# Patient Record
Sex: Female | Born: 1960 | Hispanic: Yes | Marital: Married | Smoking: Never smoker
Health system: Southern US, Community
[De-identification: ages and names within clinical notes are randomized; demographics above are authoritative.]

## PROBLEM LIST (undated history)

## (undated) DIAGNOSIS — S83281A Other tear of lateral meniscus, current injury, right knee, initial encounter: Secondary | ICD-10-CM

## (undated) DIAGNOSIS — E119 Type 2 diabetes mellitus without complications: Secondary | ICD-10-CM

## (undated) DIAGNOSIS — E785 Hyperlipidemia, unspecified: Secondary | ICD-10-CM

## (undated) HISTORY — DX: Type 2 diabetes mellitus without complications: E11.9

---

## 2000-08-05 ENCOUNTER — Encounter: Admission: RE | Admit: 2000-08-05 | Discharge: 2000-08-05 | Payer: Self-pay | Admitting: *Deleted

## 2001-04-14 ENCOUNTER — Encounter: Admission: RE | Admit: 2001-04-14 | Discharge: 2001-04-14 | Payer: Self-pay | Admitting: Family Medicine

## 2003-05-24 ENCOUNTER — Emergency Department (HOSPITAL_COMMUNITY): Admission: EM | Admit: 2003-05-24 | Discharge: 2003-05-24 | Payer: Self-pay | Admitting: Emergency Medicine

## 2003-05-24 ENCOUNTER — Encounter: Payer: Self-pay | Admitting: Emergency Medicine

## 2014-03-03 ENCOUNTER — Other Ambulatory Visit (HOSPITAL_COMMUNITY): Payer: Self-pay | Admitting: Internal Medicine

## 2014-03-03 DIAGNOSIS — Z1231 Encounter for screening mammogram for malignant neoplasm of breast: Secondary | ICD-10-CM

## 2014-03-11 ENCOUNTER — Ambulatory Visit (HOSPITAL_COMMUNITY)
Admission: RE | Admit: 2014-03-11 | Discharge: 2014-03-11 | Disposition: A | Discharge status: Home / Self Care | Payer: Self-pay | Source: Ambulatory Visit | Attending: Internal Medicine | Admitting: Internal Medicine

## 2014-03-11 DIAGNOSIS — Z1231 Encounter for screening mammogram for malignant neoplasm of breast: Secondary | ICD-10-CM

## 2015-09-22 DIAGNOSIS — Z0100 Encounter for examination of eyes and vision without abnormal findings: Secondary | ICD-10-CM

## 2015-09-22 NOTE — Congregational Nurse Program (Signed)
Brief vision with eye card chart.  CN referred to FAI contact who will notify the [facility] for exam and eye glasses. Flu vaccine administered.

## 2016-10-17 ENCOUNTER — Encounter (HOSPITAL_COMMUNITY): Payer: Self-pay | Admitting: Emergency Medicine

## 2016-10-17 ENCOUNTER — Ambulatory Visit (HOSPITAL_COMMUNITY)
Admission: EM | Admit: 2016-10-17 | Discharge: 2016-10-17 | Disposition: A | Discharge status: Home / Self Care | Payer: Self-pay | Attending: Emergency Medicine | Admitting: Emergency Medicine

## 2016-10-17 DIAGNOSIS — H1131 Conjunctival hemorrhage, right eye: Secondary | ICD-10-CM

## 2016-10-17 DIAGNOSIS — H5711 Ocular pain, right eye: Secondary | ICD-10-CM

## 2016-10-17 NOTE — ED Triage Notes (Signed)
[facility] with a complaint of left eye pain that started yesterday. The patient denied any known injury.

## 2016-10-17 NOTE — ED Provider Notes (Signed)
CSN: [id number]     Arrival date & time 10/17/16  1259 History   None    Chief Complaint  Patient presents with  . Eye Problem   (Consider location/radiation/quality/duration/timing/severity/associated sxs/prior Treatment) Was at work today and was told she has red right eye but no sx's today.  She states that she has had on occasion some right eye flashing and sharp pain but not today and she did not have a right eye injury nor does she c/o visual acuity problems or eye pain today.   The history is provided by the patient.  Eye Problem  Location:  Right eye Onset quality:  Sudden Duration:  1 day Timing:  Constant Progression:  Worsening Chronicity:  New Relieved by:  None tried Worsened by:  Nothing Associated symptoms: redness   Risk factors: conjunctival hemorrhage     History reviewed. No pertinent past medical history. History reviewed. No pertinent surgical history. History reviewed. No pertinent family history. Social History  Substance Use Topics  . Smoking status: Never Smoker  . Smokeless tobacco: Never Used  . Alcohol use No   OB History    No data available     Review of Systems  Constitutional: Negative.   HENT: Negative.   Eyes: Positive for redness.  Respiratory: Negative.   Cardiovascular: Negative.   Gastrointestinal: Negative.   Endocrine: Negative.   Genitourinary: Negative.   Musculoskeletal: Negative.   Skin: Negative.   Allergic/Immunologic: Negative.   Neurological: Negative.   Hematological: Negative.   Psychiatric/Behavioral: Negative.     Allergies  Review of patient's allergies indicates no known allergies.  Home Medications   Prior to Admission medications   Not on File   Meds Ordered and Administered this Visit  Medications - No data to display  BP 127/86 (BP Location: Right Arm)   Pulse 84   Temp 98.2 F (36.8 C) (Oral)   Resp 16   SpO2 98%  No data found.   Physical Exam  Constitutional: She appears  well-developed and well-nourished.  HENT:  Head: Normocephalic and atraumatic.  Eyes: EOM are normal. Pupils are equal, round, and reactive to light.  Right eye conjunctival hemorrhage  Pulmonary/Chest: Effort normal and breath sounds normal.  Abdominal: Soft. Bowel sounds are normal.  Nursing note and vitals reviewed.   Urgent Care Course   Clinical Course    Procedures (including critical care time)  Labs Review Labs Reviewed - No data to display  Imaging Review No results found.   Visual Acuity Review  Right Eye Distance:   Left Eye Distance:   Bilateral Distance:    Right Eye Near:   Left Eye Near:    Bilateral Near:   (unable to visual acuity due to language barrier)      MDM  Subconjunctival hemorrhage OD - Reassurance that this will resolve with time.  Occasional right eye pains - This is not assosciated with her conjunctival hemorrhage and is not occurring at present and seems to be chronic so will refer to opthamology for eval.    Deatra CanterWilliam J , FNP 10/17/16 1459

## 2016-10-19 LAB — GLUCOSE, POCT (MANUAL RESULT ENTRY): POC Glucose: 158 mg/dl — AB (ref 70–99)

## 2017-05-14 ENCOUNTER — Encounter (HOSPITAL_COMMUNITY): Payer: Self-pay | Admitting: Emergency Medicine

## 2017-05-14 ENCOUNTER — Ambulatory Visit (HOSPITAL_COMMUNITY)
Admission: EM | Admit: 2017-05-14 | Discharge: 2017-05-14 | Disposition: A | Discharge status: Home / Self Care | Payer: Self-pay | Attending: Family Medicine | Admitting: Family Medicine

## 2017-05-14 DIAGNOSIS — M79674 Pain in right toe(s): Secondary | ICD-10-CM

## 2017-05-14 DIAGNOSIS — B351 Tinea unguium: Secondary | ICD-10-CM

## 2017-05-14 MED ORDER — IBUPROFEN 600 MG PO TABS: 600.0000 mg | ORAL_TABLET | Freq: Four times a day (QID) | ORAL | 0 refills | Status: DC | PRN

## 2017-05-14 NOTE — Discharge Instructions (Signed)
Take the ibuprofen 600 mg every 6 hours as needed for pain. Call the podiatrist listed on this page for an appointment tomorrow.

## 2017-05-14 NOTE — ED Provider Notes (Signed)
CSN: [id number]     Arrival date & time 05/14/17  1358 History   First MD Initiated Contact with Patient 05/14/17 1557     Chief Complaint  Patient presents with  . Ingrown Toenail   (Consider location/radiation/quality/duration/timing/severity/associated sxs/prior Treatment) Video Interpreter used for information.  56 year old female presents to the urgent care complaining of ingrown toenail involving the right great toe. She states that she developed discomfort approximate 4 years ago in the right great toenail. In the past 3 weeks is been getting worse.      History reviewed. No pertinent past medical history. History reviewed. No pertinent surgical history. History reviewed. No pertinent family history. Social History  Substance Use Topics  . Smoking status: Never Smoker  . Smokeless tobacco: Never Used  . Alcohol use No   OB History    No data available     Review of Systems  Constitutional: Negative.   Skin:       Pain around the right great toenail.  Neurological: Negative.   All other systems reviewed and are negative.   Allergies  Patient has no known allergies.  Home Medications   Prior to Admission medications   Medication Sig Start Date End Date Taking? Authorizing Provider  ibuprofen (ADVIL,MOTRIN) 600 MG tablet Take 1 tablet (600 mg total) by mouth every 6 (six) hours as needed. 05/14/17   Hayden RasmussenMabe, Deion Swift, NP   Meds Ordered and Administered this Visit  Medications - No data to display  BP 122/78 (BP Location: Left Arm)   Pulse 61   Temp 98.4 F (36.9 C) (Oral)   Resp 16   SpO2 97%  No data found.   Physical Exam  Constitutional: She is oriented to person, place, and time. She appears well-developed and well-nourished. No distress.  Eyes: EOM are normal.  Neck: Neck supple.  Cardiovascular: Normal rate.   Pulmonary/Chest: Effort normal. No respiratory distress.  Musculoskeletal: She exhibits no edema.  Neurological: She is alert and oriented to  person, place, and time. She exhibits normal muscle tone.  Skin: Skin is warm and dry.  No apparent ingrown toenail involving the right great toe. There is no redness, swelling, drainage or tenderness to the soft tissue around the toenail. Tenderness is to the toenail directly. It is thick and yellow and the edges are curved inwards. No evidence of trauma. Skin is intact. No fluctuance no lesions.  Psychiatric: She has a normal mood and affect.  Nursing note and vitals reviewed.   Urgent Care Course     Procedures (including critical care time)  Labs Review Labs Reviewed - No data to display  Imaging Review No results found.   Visual Acuity Review  Right Eye Distance:   Left Eye Distance:   Bilateral Distance:    Right Eye Near:   Left Eye Near:    Bilateral Near:         MDM   1. Onychomycosis of right great toe   2. Pain of right great toe    Take the ibuprofen 600 mg every 6 hours as needed for pain. Call the podiatrist listed on this page for an appointment tomorrow.     Hayden RasmussenMabe, Cem Kosman, NP 05/14/17 1624

## 2017-05-14 NOTE — ED Triage Notes (Signed)
Pt here for right greater ingrown toe nail onset 3 week.  A&O x4... NAD... Ambulatory

## 2017-06-05 ENCOUNTER — Ambulatory Visit (INDEPENDENT_AMBULATORY_CARE_PROVIDER_SITE_OTHER): Payer: Self-pay | Admitting: Podiatry

## 2017-06-05 ENCOUNTER — Encounter: Payer: Self-pay | Admitting: Podiatry

## 2017-06-05 DIAGNOSIS — L600 Ingrowing nail: Secondary | ICD-10-CM

## 2017-06-05 NOTE — Progress Notes (Signed)
   Subjective:    Patient ID: Wanda Bailey, female    DOB: [birth year], 56 y.o.   MRN: [id number]  HPI Chief Complaint  Patient presents with  . Ingrown Toenail    Rt foot 1st toenail pain      Review of Systems  Skin: Positive for color change.       Objective:   Physical Exam        Assessment & Plan:

## 2017-06-05 NOTE — Patient Instructions (Signed)

## 2017-06-06 NOTE — Progress Notes (Signed)
Subjective:    Patient ID: Wanda Bailey, female   DOB: 56 y.o.   MRN: [id number]   HPI patient presents with a thick painful right hallux nail    Review of Systems  All other systems reviewed and are negative.       Objective:  Physical Exam  Cardiovascular: Intact distal pulses.   Musculoskeletal: Normal range of motion.  Neurological: She is alert.  Skin: Skin is warm.  Nursing note and vitals reviewed.  Neurovascular status intact muscle strength adequate with patient found to have a thickened deformed right hallux nail that's been painful when pressed     Assessment:    Ingrown toenail deformity with pain right hallux     Plan:   H&P condition reviewed and recommended removal of the nail at this time. Explained procedure and risk and patient wants surgery and today I infiltrated 60 Milligan times like Marcaine mixture remove the hallux nail exposed matrix and applied phenol for applications 30 seconds followed by alcohol lavaged sterile dressing. Reappoint to recheck

## 2017-09-09 ENCOUNTER — Encounter (HOSPITAL_COMMUNITY): Payer: Self-pay | Admitting: Emergency Medicine

## 2017-09-09 ENCOUNTER — Ambulatory Visit (HOSPITAL_COMMUNITY)
Admission: EM | Admit: 2017-09-09 | Discharge: 2017-09-09 | Disposition: A | Discharge status: Home / Self Care | Payer: Self-pay | Attending: Urgent Care | Admitting: Urgent Care

## 2017-09-09 DIAGNOSIS — L739 Follicular disorder, unspecified: Secondary | ICD-10-CM

## 2017-09-09 DIAGNOSIS — R21 Rash and other nonspecific skin eruption: Secondary | ICD-10-CM

## 2017-09-09 HISTORY — DX: Hyperlipidemia, unspecified: E78.5

## 2017-09-09 MED ORDER — CEPHALEXIN 500 MG PO CAPS: 500.0000 mg | ORAL_CAPSULE | Freq: Three times a day (TID) | ORAL | 0 refills | Status: DC

## 2017-09-09 NOTE — ED Provider Notes (Signed)
MRN: [id number] DOB: October 15, 1961  Subjective:   Wanda Bailey is a 56 y.o. female presenting for chief complaint of Rash  Reports 1 week history of itching over her scalp towards the back of her head that has now become painful bumps. She was seen at a different practice and reports that she started using a cream she cannot remember the name of. It helped very little. Now she states that she is having other similar red bumps over her left chin. Denies fever, numbness or tingling, oral lesions, rash over palms and soles of feet, facial swelling, chest tightness. She is keeping her normal routine, not eating new foods or using new hygiene products.   [name] is currently is a topical cream as above and has No Known Allergies.  [name]  has a past medical history of Hyperlipidemia. Denies past surgical history.  Objective:   Vitals: BP 140/75   Pulse 64   Temp 98.3 F (36.8 C) (Oral)   Resp 16   Wt 215 lb (97.5 kg)   SpO2 96%   Physical Exam  Constitutional: She is oriented to person, place, and time. She appears well-developed and well-nourished.  HENT:  Head:    Cardiovascular: Normal rate.   Pulmonary/Chest: Effort normal.  Neurological: She is alert and oriented to person, place, and time.   Assessment and Plan :   Rash and nonspecific skin eruption  Folliculitis   Likely started out as an allergic rash given initial symptom of itching only. This is now a folliculitis so I will have patient start Keflex with Zyrtec and Zantac. She is to monitor the products she is using for hygiene and use Dove for gentle skin only. Return-to-clinic precautions discussed, patient verbalized understanding.   Wallis Bamberg, PA-C [facility]  09/09/2017  6:42 PM    Wallis Bamberg, PA-C 09/09/17 1900

## 2017-09-09 NOTE — ED Triage Notes (Signed)
PT reports a bump on back of head. PT reports it was itchy and she was given a cream for it. PT reports it has no resolved.

## 2017-09-09 NOTE — Discharge Instructions (Signed)
Por favor use Dove for gentle skin para banarse. Tambien use Zyrtec (cetirizine)  una vez diaramente. Tome Zyrtec junto con Zantac (ranitidine)  dos veces diaramente.

## 2020-08-25 ENCOUNTER — Encounter (HOSPITAL_COMMUNITY): Payer: Self-pay | Admitting: *Deleted

## 2020-08-25 ENCOUNTER — Emergency Department (HOSPITAL_COMMUNITY)
Admission: EM | Admit: 2020-08-25 | Discharge: 2020-08-26 | Disposition: A | Discharge status: Home / Self Care | Payer: Self-pay | Attending: Emergency Medicine | Admitting: Emergency Medicine

## 2020-08-25 ENCOUNTER — Other Ambulatory Visit: Payer: Self-pay

## 2020-08-25 DIAGNOSIS — M545 Low back pain: Secondary | ICD-10-CM | POA: Insufficient documentation

## 2020-08-25 DIAGNOSIS — R110 Nausea: Secondary | ICD-10-CM | POA: Insufficient documentation

## 2020-08-25 DIAGNOSIS — Z5321 Procedure and treatment not carried out due to patient leaving prior to being seen by health care provider: Secondary | ICD-10-CM | POA: Insufficient documentation

## 2020-08-25 LAB — COMPREHENSIVE METABOLIC PANEL
ALT: 26 U/L (ref 0–44)
AST: 28 U/L (ref 15–41)
Albumin: 4.2 g/dL (ref 3.5–5.0)
Alkaline Phosphatase: 60 U/L (ref 38–126)
Anion gap: 12 (ref 5–15)
BUN: 20 mg/dL (ref 6–20)
CO2: 25 mmol/L (ref 22–32)
Calcium: 9.3 mg/dL (ref 8.9–10.3)
Chloride: 103 mmol/L (ref 98–111)
Creatinine, Ser: 0.72 mg/dL (ref 0.44–1.00)
GFR calc Af Amer: >60 mL/min (ref >60)
GFR calc non Af Amer: >60 mL/min (ref >60)
Glucose, Bld: 127 mg/dL — ABNORMAL HIGH (ref 70–99)
Potassium: 3.9 mmol/L (ref 3.5–5.1)
Sodium: 140 mmol/L (ref 135–145)
Total Bilirubin: 0.5 mg/dL (ref 0.3–1.2)
Total Protein: 7.5 g/dL (ref 6.5–8.1)

## 2020-08-25 LAB — URINALYSIS, ROUTINE W REFLEX MICROSCOPIC
Bacteria, UA: NONE SEEN
Bilirubin Urine: NEGATIVE
Glucose, UA: NEGATIVE mg/dL
Hgb urine dipstick: NEGATIVE
Ketones, ur: NEGATIVE mg/dL
Leukocytes,Ua: NEGATIVE
Nitrite: NEGATIVE
Protein, ur: NEGATIVE mg/dL
Specific Gravity, Urine: 1.026 (ref 1.005–1.030)
pH: 5 (ref 5.0–8.0)

## 2020-08-25 LAB — CBC
HCT: 41.3 % (ref 36.0–46.0)
Hemoglobin: 12.7 g/dL (ref 12.0–15.0)
MCH: 28 pg (ref 26.0–34.0)
MCHC: 30.8 g/dL (ref 30.0–36.0)
MCV: 91 fL (ref 80.0–100.0)
Platelets: 238 10*3/uL (ref 150–400)
RBC: 4.54 MIL/uL (ref 3.87–5.11)
RDW: 13.9 % (ref 11.5–15.5)
WBC: 10.9 10*3/uL — ABNORMAL HIGH (ref 4.0–10.5)
nRBC: 0 % (ref 0.0–0.2)

## 2020-08-25 NOTE — ED Notes (Signed)
Called pt name x3 for room. No response from pt.

## 2020-08-25 NOTE — ED Triage Notes (Signed)
The pt has had lower back pain tonight with nauseated

## 2021-06-24 ENCOUNTER — Ambulatory Visit (INDEPENDENT_AMBULATORY_CARE_PROVIDER_SITE_OTHER): Payer: Self-pay

## 2021-06-24 ENCOUNTER — Ambulatory Visit
Admission: EM | Admit: 2021-06-24 | Discharge: 2021-06-24 | Disposition: A | Discharge status: Home / Self Care | Payer: Self-pay | Attending: Emergency Medicine | Admitting: Emergency Medicine

## 2021-06-24 ENCOUNTER — Ambulatory Visit: Payer: Self-pay

## 2021-06-24 ENCOUNTER — Encounter: Payer: Self-pay | Admitting: Emergency Medicine

## 2021-06-24 ENCOUNTER — Other Ambulatory Visit: Payer: Self-pay

## 2021-06-24 DIAGNOSIS — M25561 Pain in right knee: Secondary | ICD-10-CM

## 2021-06-24 MED ORDER — NAPROXEN 500 MG PO TABS: 500.0000 mg | ORAL_TABLET | Freq: Two times a day (BID) | ORAL | 0 refills | Status: DC

## 2021-06-24 MED ORDER — PREDNISONE 10 MG PO TABS: ORAL_TABLET | ORAL | 0 refills | Status: DC

## 2021-06-24 NOTE — ED Provider Notes (Signed)
[facility]    CSN: [id number] Arrival date & time: 06/24/21  2706      History   Chief Complaint Chief Complaint  Patient presents with   Knee Pain    HPI Wanda Bailey is a 60 y.o. female history of hyperlipidemia presenting today for evaluation of knee pain and swelling. Symptoms have been going on for 2 weeks. Denies injury/fall/increase in activity. Reports swelling into leg. Denies pain initially, pain began approximately 1 week. Denies history of knee problems. Has tried ibuprofen 800 mg.  Denies history of arthritis.  Symptoms worse with standing.  Denies any sensation of instability already popping or tearing sensations.  HPI  Past Medical History:  Diagnosis Date   Hyperlipidemia     There are no problems to display for this patient.   History reviewed. No pertinent surgical history.  OB History   No obstetric history on file.      Home Medications    Prior to Admission medications   Medication Sig Start Date End Date Taking? Authorizing Provider  ibuprofen (ADVIL,MOTRIN) 600 MG tablet Take 1 tablet (600 mg total) by mouth every 6 (six) hours as needed. 05/14/17   Hayden Rasmussen, NP  naproxen (NAPROSYN) 500 MG tablet Take 1 tablet (500 mg total) by mouth 2 (two) times daily. [place] si necesita despues de termina el curso de prednisone; Toma con comida 06/24/21  Yes Abas Leicht C, PA-C  predniSONE (DELTASONE) 10 MG tablet Begin with 6 tabs on day 1, 5 tab on day 2, 4 tab on day 3, 3 tab on day 4, 2 tab on day 5, 1 tab on day 6-take with food 06/24/21  Yes Davidmichael Zarazua, Jemison C, PA-C    Family History History reviewed. No pertinent family history.  Social History Social History   Tobacco Use   Smoking status: Never   Smokeless tobacco: Never  Substance Use Topics   Alcohol use: No     Allergies   Patient has no known allergies.   Review of Systems Review of Systems  Constitutional:  Negative for fatigue and fever.  Eyes:  Negative for  visual disturbance.  Respiratory:  Negative for shortness of breath.   Cardiovascular:  Negative for chest pain.  Gastrointestinal:  Negative for abdominal pain, nausea and vomiting.  Musculoskeletal:  Positive for arthralgias and joint swelling.  Skin:  Negative for color change, rash and wound.  Neurological:  Negative for dizziness, weakness, light-headedness and headaches.    Physical Exam Triage Vital Signs ED Triage Vitals  Enc Vitals Group     BP      Pulse      Resp      Temp      Temp src      SpO2      Weight      Height      Head Circumference      Peak Flow      Pain Score      Pain Loc      Pain Edu?      Excl. in GC?    No data found.  Updated Vital Signs BP 128/65 (BP Location: Left Arm)   Pulse 68   Temp 98.3 F (36.8 C) (Oral)   Resp 18   SpO2 99%   Visual Acuity Right Eye Distance:   Left Eye Distance:   Bilateral Distance:    Right Eye Near:   Left Eye Near:    Bilateral Near:  Physical Exam Vitals and nursing note reviewed.  Constitutional:      Appearance: She is well-developed.     Comments: No acute distress  HENT:     Head: Normocephalic and atraumatic.     Nose: Nose normal.  Eyes:     Conjunctiva/sclera: Conjunctivae normal.  Cardiovascular:     Rate and Rhythm: Normal rate.  Pulmonary:     Effort: Pulmonary effort is normal. No respiratory distress.  Abdominal:     General: There is no distension.  Musculoskeletal:        General: Normal range of motion.     Cervical back: Neck supple.     Comments: Right knee with significant swelling diffusely compared to left, no overlying erythema or warmth, nontender to palpation over patella, tenderness to palpation to bilateral lower joint lines, tenderness does not extend into popliteal area or suprapatellar area, no calf tenderness, no overlying calf swelling erythema, dorsalis pedis 2+ distally; limited flexion to approximately 120 degrees  Skin:    General: Skin is warm and  dry.  Neurological:     Mental Status: She is alert and oriented to person, place, and time.     UC Treatments / Results  Labs (all labs ordered are listed, but only abnormal results are displayed) Labs Reviewed - No data to display  EKG   Radiology DG Knee AP/LAT W/Sunrise Right  Result Date: 06/24/2021 CLINICAL DATA:  Right knee pain with swelling for 2 weeks. No specific injury EXAM: RIGHT KNEE 3 VIEWS COMPARISON:  None. FINDINGS: Knee joint effusion. No fracture or subluxation. Mild degenerative marginal spurring seen at the tibial spines and patella. Subjective osteopenia. IMPRESSION: Joint effusion without acute osseous finding or joint space narrowing. Electronically Signed   By: Marnee Spring M.D.   On: 06/24/2021 10:57    Procedures Procedures (including critical care time)  Medications Ordered in UC Medications - No data to display  Initial Impression / Assessment and Plan / UC Course  I have reviewed the triage vital signs and the nursing notes.  Pertinent labs & imaging results that were available during my care of the patient were reviewed by me and considered in my medical decision making (see chart for details).     X-ray negative for significant arthropathy, mild spurring noted, no overlying erythema or warmth, low suspicion of septic arthritis, gout, has been using NSAIDs without relief, will trial prednisone taper followed by return to NSAIDs as needed, Ace wrap applied, ice and elevate, follow-up with sports medicine if persisting.  Low suspicion of DVT, no pain posteriorly or extending into calf, no overlying erythema or warmth.  Discussed strict return precautions. Patient verbalized understanding and is agreeable with plan.  Final Clinical Impressions(s) / UC Diagnoses   Final diagnoses:  Acute pain of right knee     Discharge Instructions      [place] prednisone taper x6 dias-empiece con 6 pastillas dia 1, 5 pastillas dia 2, 4 pastillas dia 3, 3 en  dia 4, 2 en dia 5, 1 dia 6- toma con comida y en la manana si puede [place] naprosyn con comida despures de terminar el curso de prednisone - 2 veces al dia si necesita para dolor, hinchazon Levante su pierna y aplique helo Regrese si no mejoran o tiene Multimedia programmer, mas hinchazon, cambio en el color del piel     ED Prescriptions     Medication Sig Dispense Auth. Provider   predniSONE (DELTASONE) 10 MG tablet Begin with 6 tabs  on day 1, 5 tab on day 2, 4 tab on day 3, 3 tab on day 4, 2 tab on day 5, 1 tab on day 6-take with food 21 tablet Josslynn Mentzer C, PA-C   naproxen (NAPROSYN) 500 MG tablet Take 1 tablet (500 mg total) by mouth 2 (two) times daily. [place] si necesita despues de termina el curso de prednisone; Toma con comida 30 tablet Shaeley Segall, [place] C, PA-C      PDMP not reviewed this encounter.   Lew Dawes, PA-C 06/24/21 1143

## 2021-06-24 NOTE — ED Triage Notes (Signed)
Pt sts right knee pain with some swelling x 2 weeks; unsure of injury

## 2021-06-24 NOTE — Discharge Instructions (Addendum)
[place] prednisone taper x6 dias-empiece con 6 pastillas dia 1, 5 pastillas dia 2, 4 pastillas dia 3, 3 en dia 4, 2 en dia 5, 1 dia 6- toma con comida y en la manana si puede [place] naprosyn con comida despures de terminar el curso de prednisone - 2 veces al dia si necesita para dolor, hinchazon Levante su pierna y aplique helo Regrese si no mejoran o tiene Multimedia programmer, mas hinchazon, cambio en el color del piel

## 2021-07-11 ENCOUNTER — Ambulatory Visit (INDEPENDENT_AMBULATORY_CARE_PROVIDER_SITE_OTHER): Payer: Self-pay | Admitting: Orthopaedic Surgery

## 2021-07-11 VITALS — Ht 61.0 in | Wt 193.0 lb

## 2021-07-11 DIAGNOSIS — M1711 Unilateral primary osteoarthritis, right knee: Secondary | ICD-10-CM

## 2021-07-11 MED ORDER — LIDOCAINE HCL 1 % IJ SOLN
2.0000 mL | INTRAMUSCULAR | Status: AC | PRN
  Administered 2021-07-11: 2 mL

## 2021-07-11 MED ORDER — METHYLPREDNISOLONE ACETATE 40 MG/ML IJ SUSP
40.0000 mg | INTRAMUSCULAR | Status: AC | PRN
  Administered 2021-07-11: 40 mg via INTRA_ARTICULAR

## 2021-07-11 MED ORDER — BUPIVACAINE HCL 0.5 % IJ SOLN
2.0000 mL | INTRAMUSCULAR | Status: AC | PRN
  Administered 2021-07-11: 2 mL via INTRA_ARTICULAR

## 2021-07-11 NOTE — Progress Notes (Signed)
Office Visit Note   Patient: Wanda Bailey           Date of Birth: 1961/01/22           MRN: [id number] Visit Date: 07/11/2021              Requested by: No referring provider defined for this encounter. PCP: Patient, No Pcp Per (Inactive)   Assessment & Plan: Visit Diagnoses:  1. Primary osteoarthritis of right knee     Plan: Based on findings impression is right knee OA exacerbation with effusion.  Aspiration injection performed today.  15 cc obtained.  She will take it easy over the next month and then gradually increase activity as tolerated.  Fluid sent to lab.    Follow-Up Instructions: Return if symptoms worsen or fail to improve.   Orders:  Orders Placed This Encounter  Procedures   Large Joint Inj   No orders of the defined types were placed in this encounter.     Procedures: Large Joint Inj: R knee on 07/11/2021 3:22 PM Indications: pain Details: 22 G needle  Arthrogram: No  Medications: 40 mg methylPREDNISolone acetate 40 MG/ML; 2 mL lidocaine 1 %; 2 mL bupivacaine 0.5 % Consent was given by the patient. Patient was prepped and draped in the usual sterile fashion.      Clinical Data: No additional findings.   Subjective: Chief Complaint  Patient presents with   Right Knee - Pain    Ms. Wanda Bailey is a 59 year old Wanda here with interpreter for chronic right knee pain for a few months.  Denies any injuries.  This is progressively gotten worse and she reports subjective swelling.  Denies any mechanical symptoms.  She reports painless popping.  She has been to her PCP who has prescribed naproxen and a prednisone Dosepak without any relief.   Review of Systems  Constitutional: Negative.   HENT: Negative.    Eyes: Negative.   Respiratory: Negative.    Cardiovascular: Negative.   Endocrine: Negative.   Musculoskeletal: Negative.   Neurological: Negative.   Hematological: Negative.   Psychiatric/Behavioral: Negative.    All other systems  reviewed and are negative.   Objective: Vital Signs: Ht 5\' 1"  (1.549 m)   Wt 193 lb (87.5 kg)   BMI 36.47 kg/m   Physical Exam Vitals and nursing note reviewed.  Constitutional:      Appearance: She is well-developed.  HENT:     Head: Normocephalic and atraumatic.  Pulmonary:     Effort: Pulmonary effort is normal.  Abdominal:     Palpations: Abdomen is soft.  Musculoskeletal:     Cervical back: Neck supple.  Skin:    General: Skin is warm.     Capillary Refill: Capillary refill takes less than 2 seconds.  Neurological:     Mental Status: She is alert and oriented to person, place, and time.  Psychiatric:        Behavior: Behavior normal.        Thought Content: Thought content normal.        Judgment: Judgment normal.    Ortho Exam Right knee shows a moderate joint effusion.  She lacks a few degrees of full extension.  Collaterals and cruciates are stable.  No joint line tenderness.  No significant crepitus. Specialty Comments:  No specialty comments available.  Imaging: No results found.   PMFS History: There are no problems to display for this patient.  Past Medical History:  Diagnosis Date   Hyperlipidemia  No family history on file.  No past surgical history on file. Social History   Occupational History   Not on file  Tobacco Use   Smoking status: Never   Smokeless tobacco: Never  Substance and Sexual Activity   Alcohol use: No   Drug use: Not on file   Sexual activity: Not on file

## 2021-07-11 NOTE — Addendum Note (Signed)
Addended by: Shonna Chock on: 07/11/2021 03:53 PM   Modules accepted: Orders

## 2021-07-12 LAB — SYNOVIAL FLUID ANALYSIS, COMPLETE
Basophils, %: 0 %
Eosinophils-Synovial: 3 % — ABNORMAL HIGH (ref 0–2)
Lymphocytes-Synovial Fld: 56 % (ref 0–74)
Monocyte/Macrophage: 36 % (ref 0–69)
Neutrophil, Synovial: 5 % (ref 0–24)
Synoviocytes, %: 0 % (ref 0–15)
WBC, Synovial: 368 cells/uL — ABNORMAL HIGH (ref <150)

## 2021-07-12 LAB — TIQ-NTM

## 2021-08-08 ENCOUNTER — Telehealth: Payer: Self-pay | Admitting: Orthopaedic Surgery

## 2021-08-08 NOTE — Telephone Encounter (Signed)
She needed an appt.  Appt made when she came in our office

## 2021-08-08 NOTE — Telephone Encounter (Signed)
Patient called asked if she could get a call back by someone that speaks spanish.  The number to contact patient is [phone]

## 2021-08-09 ENCOUNTER — Encounter: Payer: Self-pay | Admitting: Orthopaedic Surgery

## 2021-08-09 ENCOUNTER — Other Ambulatory Visit: Payer: Self-pay

## 2021-08-09 ENCOUNTER — Ambulatory Visit (INDEPENDENT_AMBULATORY_CARE_PROVIDER_SITE_OTHER): Payer: Self-pay | Admitting: Orthopaedic Surgery

## 2021-08-09 DIAGNOSIS — M25461 Effusion, right knee: Secondary | ICD-10-CM

## 2021-08-09 DIAGNOSIS — M1711 Unilateral primary osteoarthritis, right knee: Secondary | ICD-10-CM

## 2021-08-09 MED ORDER — LIDOCAINE HCL 1 % IJ SOLN
2.0000 mL | INTRAMUSCULAR | Status: AC | PRN
  Administered 2021-08-09: 2 mL

## 2021-08-09 MED ORDER — BUPIVACAINE HCL 0.5 % IJ SOLN
2.0000 mL | INTRAMUSCULAR | Status: AC | PRN
  Administered 2021-08-09: 2 mL via INTRA_ARTICULAR

## 2021-08-09 MED ORDER — METHYLPREDNISOLONE ACETATE 40 MG/ML IJ SUSP
40.0000 mg | INTRAMUSCULAR | Status: AC | PRN
  Administered 2021-08-09: 40 mg via INTRA_ARTICULAR

## 2021-08-09 NOTE — Progress Notes (Signed)
   Office Visit Note   Patient: Ennifer Harston           Date of Birth: Aug 07, 1961           MRN: [id number] Visit Date: 08/09/2021              Requested by: No referring provider defined for this encounter. PCP: Patient, No Pcp Per (Inactive)   Assessment & Plan: Visit Diagnoses:  1. Primary osteoarthritis of right knee   2. Effusion, right knee     Plan: Impression is right knee osteoarthritis with recurrent effusion.  Repeat aspiration and injection with cortisone today.  We obtained 10 cc of synovial fluid.  Going forward I strongly urged her to apply for [facility] financial assistance so that future medical care will not be so financially burdensome.  Follow-Up Instructions: Return if symptoms worsen or fail to improve.   Orders:  No orders of the defined types were placed in this encounter.  No orders of the defined types were placed in this encounter.     Procedures: Large Joint Inj: R knee on 08/09/2021 6:33 PM Indications: pain Details: 22 G needle  Arthrogram: No  Medications: 40 mg methylPREDNISolone acetate 40 MG/ML; 2 mL lidocaine 1 %; 2 mL bupivacaine 0.5 % Consent was given by the patient. Patient was prepped and draped in the usual sterile fashion.      Clinical Data: No additional findings.   Subjective: Chief Complaint  Patient presents with   Right Knee - Follow-up    [name] comes back in today for recurrent right knee pain and effusion.  Translator present today.  She continues to have pain in the right knee and has difficulty straightening the knee completely.   Review of Systems  Constitutional: Negative.   HENT: Negative.    Eyes: Negative.   Respiratory: Negative.    Cardiovascular: Negative.   Endocrine: Negative.   Musculoskeletal: Negative.   Neurological: Negative.   Hematological: Negative.   Psychiatric/Behavioral: Negative.    All other systems reviewed and are negative.   Objective: Vital Signs: There were no  vitals taken for this visit.  Physical Exam Vitals and nursing note reviewed.  Constitutional:      Appearance: She is well-developed.  Pulmonary:     Effort: Pulmonary effort is normal.  Skin:    General: Skin is warm.     Capillary Refill: Capillary refill takes less than 2 seconds.  Neurological:     Mental Status: She is alert and oriented to person, place, and time.  Psychiatric:        Behavior: Behavior normal.        Thought Content: Thought content normal.        Judgment: Judgment normal.    Ortho Exam Right knee shows a small joint effusion.  Moderate pain and limitation with range of motion. Specialty Comments:  No specialty comments available.  Imaging: No results found.   PMFS History: There are no problems to display for this patient.  Past Medical History:  Diagnosis Date   Hyperlipidemia     History reviewed. No pertinent family history.  History reviewed. No pertinent surgical history. Social History   Occupational History   Not on file  Tobacco Use   Smoking status: Never   Smokeless tobacco: Never  Substance and Sexual Activity   Alcohol use: No   Drug use: Not on file   Sexual activity: Not on file

## 2021-10-13 ENCOUNTER — Telehealth: Payer: Self-pay

## 2021-10-13 NOTE — Telephone Encounter (Signed)
Copied from CRM [id number]. Topic: General - Other >> Oct 13, 2021  1:10 PM Jaquita Rector A wrote: Reason for CRM: Patient called in stated that she received the number to call in and request a call back from [facility] to get set up to help with a bill she have received from [facility] for past visits.   Ph# [phone]

## 2021-10-16 NOTE — Telephone Encounter (Signed)
I return Pt call, she was inform that she need to be an establish Pt at the clinic, to apply for the financial programs

## 2021-11-08 ENCOUNTER — Encounter: Payer: Self-pay | Admitting: Physician Assistant

## 2021-11-08 ENCOUNTER — Other Ambulatory Visit: Payer: Self-pay

## 2021-11-08 ENCOUNTER — Ambulatory Visit (INDEPENDENT_AMBULATORY_CARE_PROVIDER_SITE_OTHER): Payer: Self-pay | Admitting: Physician Assistant

## 2021-11-08 VITALS — BP 112/75 | HR 61 | Temp 97.5°F | Resp 16 | Ht 62.0 in | Wt 196.2 lb

## 2021-11-08 DIAGNOSIS — Z758 Other problems related to medical facilities and other health care: Secondary | ICD-10-CM

## 2021-11-08 DIAGNOSIS — Z0000 Encounter for general adult medical examination without abnormal findings: Secondary | ICD-10-CM

## 2021-11-08 DIAGNOSIS — G8929 Other chronic pain: Secondary | ICD-10-CM

## 2021-11-08 DIAGNOSIS — Z79899 Other long term (current) drug therapy: Secondary | ICD-10-CM

## 2021-11-08 DIAGNOSIS — R7309 Other abnormal glucose: Secondary | ICD-10-CM

## 2021-11-08 DIAGNOSIS — M25569 Pain in unspecified knee: Secondary | ICD-10-CM

## 2021-11-08 DIAGNOSIS — E7800 Pure hypercholesterolemia, unspecified: Secondary | ICD-10-CM

## 2021-11-08 DIAGNOSIS — Z789 Other specified health status: Secondary | ICD-10-CM

## 2021-11-08 MED ORDER — MELOXICAM 15 MG PO TABS: 15.0000 mg | ORAL_TABLET | Freq: Every day | ORAL | 2 refills | Status: DC

## 2021-11-08 MED ORDER — ATORVASTATIN CALCIUM 20 MG PO TABS: 20.0000 mg | ORAL_TABLET | Freq: Every day | ORAL | 1 refills | Status: DC

## 2021-11-08 NOTE — Progress Notes (Signed)
Patient is here to est care with provider. Patient said her knee hurts when she wals sometimes.

## 2021-11-08 NOTE — Progress Notes (Signed)
Patient ID: Wanda Bailey, female   DOB: 27-Mar-1961, 60 y.o.   MRN: [id number]   Wanda Bailey, is a 60 y.o. female  BUL:[id number]  [id number]:[id number]  DOB - 1960-12-18  Chief Complaint  Patient presents with   Establish Care       Subjective:   Wanda Bailey is a 60 y.o. female here today to establish care.  Dr Roda Shutters has been following for knee pain but naproxen is not helping.  Has not had blood work in a while.  Was previously on metformin and atorvastatin.     Patient has No headache, No chest pain, No abdominal pain - No Nausea, No new weakness tingling or numbness, No Cough - SOB.  No problems updated.  ALLERGIES: No Known Allergies  PAST MEDICAL HISTORY: Past Medical History:  Diagnosis Date   Diabetes mellitus without complication (HCC)    Hyperlipidemia     MEDICATIONS AT HOME: Prior to Admission medications   Medication Sig Start Date End Date Taking? Authorizing Provider  meloxicam (MOBIC) 15 MG tablet Take 1 tablet (15 mg total) by mouth daily. Prn pain 11/08/21  Yes Anders Simmonds, PA-C  atorvastatin (LIPITOR) 20 MG tablet Take 1 tablet (20 mg total) by mouth daily. 11/08/21   Arlis Yale, Marzella Schlein, PA-C    ROS: Neg HEENT Neg resp Neg cardiac Neg GI Neg GU Neg psych Neg neuro  Objective:   Vitals:   11/08/21 0854  BP: 112/75  Pulse: 61  Resp: 16  Temp: (!) 97.5 F (36.4 C)  TempSrc: Oral  SpO2: 95%  Weight: 196 lb 3.2 oz (89 kg)  Height: 5\' 2"  (1.575 m)   Exam General appearance : Awake, alert, not in any distress. Speech Clear. Not toxic looking HEENT: Atraumatic and Normocephalic Neck: Supple, no JVD. No cervical lymphadenopathy.  Chest: Good air entry bilaterally, CTAB.  No rales/rhonchi/wheezing CVS: S1 S2 regular, no murmurs.  Extremities: B/L Lower Ext shows no edema, both legs are warm to touch Neurology: Awake alert, and oriented X 3, CN II-XII intact, Non focal Skin: No Rash  Data Review No results found for:  HGBA1C  Assessment & Plan   1. Elevated glucose I have had a lengthy discussion and provided education about insulin resistance and the intake of too much sugar/refined carbohydrates.  I have advised the patient to work at a goal of eliminating sugary drinks, candy, desserts, sweets, refined sugars, processed foods, and white carbohydrates.  The patient expresses understanding.   - Hemoglobin A1c  2. Elevated cholesterol - Lipid panel - atorvastatin (LIPITOR) 20 MG tablet; Take 1 tablet (20 mg total) by mouth daily.  Dispense: 90 tablet; Refill: 1  3. High risk medication use - Lipid panel - Comprehensive metabolic panel  4. Encounter for medical examination to establish care  5. Chronic knee pain, unspecified laterality F/up with Dr - meloxicam (MOBIC) 15 MG tablet; Take 1 tablet (15 mg total) by mouth daily. Prn pain  Dispense: 30 tablet; Refill: 2  6. Language barrier AMN interpreters (Arturo)used and additional time performing visit was required.     Patient have been counseled extensively about nutrition and exercise. Other issues discussed during this visit include: low cholesterol diet, weight control and daily exercise, foot care, annual eye examinations at Ophthalmology, importance of adherence with medications and regular follow-up. We also discussed long term complications of uncontrolled diabetes and hypertension.   Return in about 3 months (around 02/08/2022) for assign PCP.  The patient was given clear instructions to  go to ER or return to medical center if symptoms don't improve, worsen or new problems develop. The patient verbalized understanding. The patient was told to call to get lab results if they haven't heard anything in the next week.      Georgian Co, PA-C [facility] [place], [place] [phone]   11/08/2021, 9:27 AM

## 2021-11-09 LAB — LIPID PANEL
Chol/HDL Ratio: 5 ratio — ABNORMAL HIGH (ref 0.0–4.4)
Cholesterol, Total: 285 mg/dL — ABNORMAL HIGH (ref 100–199)
HDL: 57 mg/dL (ref >39)
LDL Chol Calc (NIH): 197 mg/dL — ABNORMAL HIGH (ref 0–99)
Triglycerides: 168 mg/dL — ABNORMAL HIGH (ref 0–149)
VLDL Cholesterol Cal: 31 mg/dL (ref 5–40)

## 2021-11-09 LAB — COMPREHENSIVE METABOLIC PANEL
ALT: 18 IU/L (ref 0–32)
AST: 16 IU/L (ref 0–40)
Albumin/Globulin Ratio: 2 (ref 1.2–2.2)
Albumin: 4.8 g/dL (ref 3.8–4.9)
Alkaline Phosphatase: 84 IU/L (ref 44–121)
BUN/Creatinine Ratio: 29 — ABNORMAL HIGH (ref 12–28)
BUN: 16 mg/dL (ref 8–27)
Bilirubin Total: 0.6 mg/dL (ref 0.0–1.2)
CO2: 23 mmol/L (ref 20–29)
Calcium: 9.3 mg/dL (ref 8.7–10.3)
Chloride: 103 mmol/L (ref 96–106)
Creatinine, Ser: 0.55 mg/dL — ABNORMAL LOW (ref 0.57–1.00)
Globulin, Total: 2.4 g/dL (ref 1.5–4.5)
Glucose: 93 mg/dL (ref 70–99)
Potassium: 4.1 mmol/L (ref 3.5–5.2)
Sodium: 141 mmol/L (ref 134–144)
Total Protein: 7.2 g/dL (ref 6.0–8.5)
eGFR: 105 mL/min/{1.73_m2} (ref >59)

## 2021-11-09 LAB — HEMOGLOBIN A1C
Est. average glucose Bld gHb Est-mCnc: 134 mg/dL
Hgb A1c MFr Bld: 6.3 % — ABNORMAL HIGH (ref 4.8–5.6)

## 2021-11-13 ENCOUNTER — Other Ambulatory Visit: Payer: Self-pay | Admitting: Physician Assistant

## 2021-11-13 DIAGNOSIS — R7303 Prediabetes: Secondary | ICD-10-CM

## 2021-11-13 MED ORDER — METFORMIN HCL 500 MG PO TABS: 500.0000 mg | ORAL_TABLET | Freq: Two times a day (BID) | ORAL | 3 refills | Status: DC

## 2021-11-13 NOTE — Progress Notes (Signed)
Patient called and no answer. LVM via interpreter [phone]) to call office back

## 2021-11-21 ENCOUNTER — Telehealth (INDEPENDENT_AMBULATORY_CARE_PROVIDER_SITE_OTHER): Payer: Self-pay

## 2021-11-21 NOTE — Telephone Encounter (Signed)
Call placed to patient with assistance of pacific interpreter [phone]). Patient aware of need to resume metformin and atorvastatin. Both sent to pharmacy. Advised to drink 80-100 ounces of water daily and eat a low sugar low fat diet. She verbalized understanding. Maryjean Morn, CMA

## 2021-11-21 NOTE — Telephone Encounter (Signed)
-----   Message from Guy Franco, RN sent at 11/13/2021 10:56 AM EST -----  ----- Message ----- From: Anders Simmonds, PA-C Sent: 11/13/2021   9:08 AM EST To: Kieth Brightly, RMA  Please call patient.  She definitely needs to resume the atorvastatin bc cholesterol was very high.  She is in the prediabetes range and I sent her a prescription of metformin to restart.  She should also eat low sugar and low fat diet. Drink 80-100 ounces water daily and follow up as planned.  Thanks, Georgian Co, PA-C

## 2021-12-01 ENCOUNTER — Other Ambulatory Visit: Payer: Self-pay

## 2021-12-01 ENCOUNTER — Ambulatory Visit: Payer: Self-pay | Attending: Family Medicine

## 2021-12-22 ENCOUNTER — Ambulatory Visit (INDEPENDENT_AMBULATORY_CARE_PROVIDER_SITE_OTHER): Payer: Self-pay | Admitting: Orthopaedic Surgery

## 2021-12-22 ENCOUNTER — Encounter: Payer: Self-pay | Admitting: Orthopaedic Surgery

## 2021-12-22 ENCOUNTER — Other Ambulatory Visit: Payer: Self-pay

## 2021-12-22 DIAGNOSIS — G8929 Other chronic pain: Secondary | ICD-10-CM

## 2021-12-22 DIAGNOSIS — M25561 Pain in right knee: Secondary | ICD-10-CM

## 2021-12-22 MED ORDER — TRAMADOL HCL 50 MG PO TABS: 50.0000 mg | ORAL_TABLET | Freq: Two times a day (BID) | ORAL | 2 refills | Status: DC | PRN

## 2021-12-22 NOTE — Progress Notes (Signed)
° °  Office Visit Note   Patient: Wanda Bailey           Date of Birth: 11-27-1961           MRN: [id number] Visit Date: 12/22/2021              Requested by: No referring provider defined for this encounter. PCP: Patient, No Pcp Per (Inactive)   Assessment & Plan: Visit Diagnoses:  1. Chronic pain of right knee     Plan: Impression is chronic right knee pain concerning for medial meniscus tear.  At this point, I would like to order an MRI of her right knee to assess for meniscal pathology, but the patient is currently awaiting [facility] financial assistance.  She would like to wait until she has been approved before proceeding with MRI.  She will call us and let us know when she is approved and we will order the scan.  She will call with concerns or questions in the meantime.  Follow-Up Instructions: Return if symptoms worsen or fail to improve.   Orders:  No orders of the defined types were placed in this encounter.  Meds ordered this encounter  Medications   traMADol (ULTRAM) 50 MG tablet    Sig: Take 1 tablet (50 mg total) by mouth every 12 (twelve) hours as needed.    Dispense:  30 tablet    Refill:  2      Procedures: No procedures performed   Clinical Data: No additional findings.   Subjective: Chief Complaint  Patient presents with   Right Knee - Pain    HPI patient is a pleasant 61 year old female who is here today with an interpreter.  She is here with continued right knee pain.  She was seen in our office back in July and then again in August where her right knee was aspirated and injected.  She denies any relief from the injection.  She continues to endorse pain to the anteromedial aspect with associated swelling locking and catching.  She notes that her symptoms are aggravated with standing as well as with twisting.  She has been taking ibuprofen with mild relief.  She has been unable to get an MRI in the past due to being self-pay.  She is currently  awaiting [facility] financial assistance.  Review of Systems as detailed in HPI.  All others reviewed and are negative.   Objective: Vital Signs: There were no vitals taken for this visit.  Physical Exam well-developed well-nourished female no acute distress.  Alert and oriented x3.  Ortho Exam right knee exam shows a small to moderate effusion.  Range of motion from 0 to 100 degrees.  She does have marked tenderness medial joint line.  Specialty Comments:  No specialty comments available.  Imaging: No new imaging   PMFS History: There are no problems to display for this patient.  Past Medical History:  Diagnosis Date   Diabetes mellitus without complication (HCC)    Hyperlipidemia     History reviewed. No pertinent family history.  History reviewed. No pertinent surgical history. Social History   Occupational History   Not on file  Tobacco Use   Smoking status: Never   Smokeless tobacco: Never  Substance and Sexual Activity   Alcohol use: No   Drug use: Not on file   Sexual activity: Not on file

## 2022-01-11 ENCOUNTER — Ambulatory Visit: Payer: Self-pay | Admitting: Orthopaedic Surgery

## 2022-01-16 ENCOUNTER — Telehealth: Payer: Self-pay | Admitting: Orthopaedic Surgery

## 2022-01-16 ENCOUNTER — Other Ambulatory Visit: Payer: Self-pay

## 2022-01-16 DIAGNOSIS — G8929 Other chronic pain: Secondary | ICD-10-CM

## 2022-01-16 DIAGNOSIS — M25561 Pain in right knee: Secondary | ICD-10-CM

## 2022-01-16 NOTE — Telephone Encounter (Signed)
Patient states she has [facility] letter already. MRI order made. They will call her to schedule appt.

## 2022-01-16 NOTE — Telephone Encounter (Signed)
Patient called. Would like Kathlee Nations to call her. [phone]

## 2022-01-22 ENCOUNTER — Encounter (INDEPENDENT_AMBULATORY_CARE_PROVIDER_SITE_OTHER): Payer: Self-pay

## 2022-01-22 ENCOUNTER — Ambulatory Visit (INDEPENDENT_AMBULATORY_CARE_PROVIDER_SITE_OTHER): Payer: Self-pay | Admitting: Family Medicine

## 2022-01-22 ENCOUNTER — Encounter: Payer: Self-pay | Admitting: Family Medicine

## 2022-01-22 VITALS — BP 114/77 | HR 60 | Temp 98.1°F | Resp 16 | Wt 202.4 lb

## 2022-01-22 DIAGNOSIS — Z789 Other specified health status: Secondary | ICD-10-CM

## 2022-01-22 DIAGNOSIS — Z0000 Encounter for general adult medical examination without abnormal findings: Secondary | ICD-10-CM

## 2022-01-22 DIAGNOSIS — Z1231 Encounter for screening mammogram for malignant neoplasm of breast: Secondary | ICD-10-CM

## 2022-01-22 DIAGNOSIS — Z130 Encounter for screening for diseases of the blood and blood-forming organs and certain disorders involving the immune mechanism: Secondary | ICD-10-CM

## 2022-01-22 DIAGNOSIS — Z1329 Encounter for screening for other suspected endocrine disorder: Secondary | ICD-10-CM

## 2022-01-22 DIAGNOSIS — Z13220 Encounter for screening for lipoid disorders: Secondary | ICD-10-CM

## 2022-01-22 LAB — POCT GLYCOSYLATED HEMOGLOBIN (HGB A1C): Hemoglobin A1C: 6.3 % — AB (ref 4.0–5.6)

## 2022-01-22 NOTE — Progress Notes (Signed)
Patient is here for CPE. Patient said she has no new concerns for provider today

## 2022-01-23 ENCOUNTER — Encounter: Payer: Self-pay | Admitting: Family Medicine

## 2022-01-23 LAB — CMP14+EGFR
ALT: 14 IU/L (ref 0–32)
AST: 15 IU/L (ref 0–40)
Albumin/Globulin Ratio: 1.7 (ref 1.2–2.2)
Albumin: 4.4 g/dL (ref 3.8–4.9)
Alkaline Phosphatase: 87 IU/L (ref 44–121)
BUN/Creatinine Ratio: 24 (ref 12–28)
BUN: 14 mg/dL (ref 8–27)
Bilirubin Total: 0.5 mg/dL (ref 0.0–1.2)
CO2: 24 mmol/L (ref 20–29)
Calcium: 9.5 mg/dL (ref 8.7–10.3)
Chloride: 103 mmol/L (ref 96–106)
Creatinine, Ser: 0.58 mg/dL (ref 0.57–1.00)
Globulin, Total: 2.6 g/dL (ref 1.5–4.5)
Glucose: 104 mg/dL — ABNORMAL HIGH (ref 70–99)
Potassium: 4 mmol/L (ref 3.5–5.2)
Sodium: 142 mmol/L (ref 134–144)
Total Protein: 7 g/dL (ref 6.0–8.5)
eGFR: 104 mL/min/{1.73_m2} (ref >59)

## 2022-01-23 LAB — CBC WITH DIFFERENTIAL/PLATELET
Basophils Absolute: 0.1 10*3/uL (ref 0.0–0.2)
Basos: 1 %
EOS (ABSOLUTE): 1.1 10*3/uL — ABNORMAL HIGH (ref 0.0–0.4)
Eos: 18 %
Hematocrit: 40.8 % (ref 34.0–46.6)
Hemoglobin: 13.2 g/dL (ref 11.1–15.9)
Immature Grans (Abs): 0 10*3/uL (ref 0.0–0.1)
Immature Granulocytes: 0 %
Lymphocytes Absolute: 2 10*3/uL (ref 0.7–3.1)
Lymphs: 31 %
MCH: 28.7 pg (ref 26.6–33.0)
MCHC: 32.4 g/dL (ref 31.5–35.7)
MCV: 89 fL (ref 79–97)
Monocytes Absolute: 0.4 10*3/uL (ref 0.1–0.9)
Monocytes: 6 %
Neutrophils Absolute: 2.8 10*3/uL (ref 1.4–7.0)
Neutrophils: 44 %
Platelets: 217 10*3/uL (ref 150–450)
RBC: 4.6 x10E6/uL (ref 3.77–5.28)
RDW: 12.9 % (ref 11.7–15.4)
WBC: 6.4 10*3/uL (ref 3.4–10.8)

## 2022-01-23 LAB — LIPID PANEL
Chol/HDL Ratio: 4.4 ratio (ref 0.0–4.4)
Cholesterol, Total: 263 mg/dL — ABNORMAL HIGH (ref 100–199)
HDL: 60 mg/dL (ref >39)
LDL Chol Calc (NIH): 178 mg/dL — ABNORMAL HIGH (ref 0–99)
Triglycerides: 139 mg/dL (ref 0–149)
VLDL Cholesterol Cal: 25 mg/dL (ref 5–40)

## 2022-01-23 LAB — TSH: TSH: 3.44 u[IU]/mL (ref 0.450–4.500)

## 2022-01-23 NOTE — Progress Notes (Signed)
Established Patient Office Visit  Subjective:  Patient ID: Wanda Bailey, female    DOB: [birth year]  Age: 61 y.o. MRN: [id number]  CC:  Chief Complaint  Patient presents with   Annual Exam    HPI Wanda Bailey presents for routine annual exam. Patient denies acute complaints or concerns. This visit was aided by an interpretor.   Past Medical History:  Diagnosis Date   Diabetes mellitus without complication ([employer])    Hyperlipidemia     No past surgical history on file.  No family history on file.  Social History   Socioeconomic History   Marital status: Married    Spouse name: Not on file   Number of children: Not on file   Years of education: Not on file   Highest education level: Not on file  Occupational History   Not on file  Tobacco Use   Smoking status: Never   Smokeless tobacco: Never  Substance and Sexual Activity   Alcohol use: No   Drug use: Not on file   Sexual activity: Not on file  Other Topics Concern   Not on file  Social History Narrative   Not on file   Social Determinants of Health   Financial Resource Strain: Not on file  Food Insecurity: Not on file  Transportation Needs: Not on file  Physical Activity: Not on file  Stress: Not on file  Social Connections: Not on file  Intimate Partner Violence: Not on file    ROS Review of Systems  All other systems reviewed and are negative.  Objective:   Today's Vitals: BP 114/77    Pulse 60    Temp 98.1 F (36.7 C) (Oral)    Resp 16    Wt 202 lb 6.4 oz (91.8 kg)    SpO2 94%    BMI 37.02 kg/m   Physical Exam Vitals and nursing note reviewed.  Constitutional:      General: She is not in acute distress. HENT:     Head: Normocephalic and atraumatic.     Right Ear: Tympanic membrane, ear canal and external ear normal.     Left Ear: Tympanic membrane, ear canal and external ear normal.     Nose: Nose normal.     Mouth/Throat:     Mouth: Mucous membranes are moist.      Pharynx: Oropharynx is clear.  Eyes:     Conjunctiva/sclera: Conjunctivae normal.     Pupils: Pupils are equal, round, and reactive to light.  Neck:     Thyroid: No thyromegaly.  Cardiovascular:     Rate and Rhythm: Normal rate and regular rhythm.     Heart sounds: Normal heart sounds. No murmur heard. Pulmonary:     Effort: Pulmonary effort is normal. No respiratory distress.     Breath sounds: Normal breath sounds.  Abdominal:     General: There is no distension.     Palpations: Abdomen is soft. There is no mass.     Tenderness: There is no abdominal tenderness.  Musculoskeletal:        General: Normal range of motion.     Cervical back: Normal range of motion and neck supple.  Skin:    General: Skin is warm and dry.  Neurological:     General: No focal deficit present.     Mental Status: She is alert and oriented to person, place, and time.  Psychiatric:        Mood and Affect: Mood normal.  Behavior: Behavior normal.    Assessment & Plan:   1. Annual physical exam Routine labs ordered.  - CBC with Differential - CMP14+EGFR  2. Screening for endocrine disorder  - POCT glycosylated hemoglobin (Hb A1C) - TSH  3. Lipid screening  - Lipid Panel  4. Encounter for screening mammogram for malignant neoplasm of breast Referral for mammogram.  - MM Digital Screening; Future  5. Language barrier to communication     Outpatient Encounter Medications as of 01/22/2022  Medication Sig   atorvastatin (LIPITOR) 20 MG tablet Take 1 tablet (20 mg total) by mouth daily.   meloxicam (MOBIC) 15 MG tablet Take 1 tablet (15 mg total) by mouth daily. Prn pain   metFORMIN (GLUCOPHAGE) 500 MG tablet Take 1 tablet (500 mg total) by mouth 2 (two) times daily with a meal.   [DISCONTINUED] metFORMIN (GLUCOPHAGE-XR) 500 MG 24 hr tablet Take 1,000 mg by mouth daily. (Patient not taking: Reported on 01/22/2022)   [DISCONTINUED] traMADol (ULTRAM) 50 MG tablet Take 1 tablet (50 mg  total) by mouth every 12 (twelve) hours as needed. (Patient not taking: Reported on 01/22/2022)   No facility-administered encounter medications on file as of 01/22/2022.    Follow-up: Return in about 6 months (around 07/22/2022) for follow up.   Becky Sax, MD

## 2022-01-25 ENCOUNTER — Telehealth: Payer: Self-pay | Admitting: Orthopaedic Surgery

## 2022-01-25 NOTE — Telephone Encounter (Signed)
Called patient was advised she does not speak english. Can you please call patient and schedule an MRI review with Dr. Erlinda Hong for me.  MRI scheduled for 02-07-2022   Thanks!

## 2022-01-25 NOTE — Telephone Encounter (Signed)
Scheduled appt.

## 2022-01-26 ENCOUNTER — Other Ambulatory Visit: Payer: Self-pay | Admitting: *Deleted

## 2022-01-26 ENCOUNTER — Telehealth: Payer: Self-pay | Admitting: Orthopaedic Surgery

## 2022-01-26 DIAGNOSIS — E7800 Pure hypercholesterolemia, unspecified: Secondary | ICD-10-CM

## 2022-01-26 MED ORDER — ATORVASTATIN CALCIUM 20 MG PO TABS: 20.0000 mg | ORAL_TABLET | Freq: Every day | ORAL | 1 refills | Status: DC

## 2022-01-26 NOTE — Addendum Note (Signed)
Addended by: Kieth Brightly on: 01/26/2022 11:51 AM   Modules accepted: Orders

## 2022-01-26 NOTE — Telephone Encounter (Signed)
Patient called. Would like Lis to call her.

## 2022-01-26 NOTE — Telephone Encounter (Signed)
Returned her call. She wanted to clarify appt.

## 2022-01-27 LAB — FECAL OCCULT BLOOD, IMMUNOCHEMICAL: Fecal Occult Bld: NEGATIVE

## 2022-02-07 ENCOUNTER — Ambulatory Visit
Admission: RE | Admit: 2022-02-07 | Discharge: 2022-02-07 | Disposition: A | Discharge status: Home / Self Care | Payer: No Typology Code available for payment source | Source: Ambulatory Visit | Attending: Orthopaedic Surgery | Admitting: Orthopaedic Surgery

## 2022-02-07 ENCOUNTER — Other Ambulatory Visit: Payer: Self-pay

## 2022-02-07 DIAGNOSIS — G8929 Other chronic pain: Secondary | ICD-10-CM

## 2022-02-07 DIAGNOSIS — M25561 Pain in right knee: Secondary | ICD-10-CM

## 2022-02-08 ENCOUNTER — Ambulatory Visit: Payer: Self-pay | Admitting: Family Medicine

## 2022-02-13 ENCOUNTER — Other Ambulatory Visit: Payer: Self-pay

## 2022-02-13 ENCOUNTER — Encounter: Payer: Self-pay | Admitting: Orthopaedic Surgery

## 2022-02-13 ENCOUNTER — Ambulatory Visit (INDEPENDENT_AMBULATORY_CARE_PROVIDER_SITE_OTHER): Payer: Self-pay | Admitting: Orthopaedic Surgery

## 2022-02-13 DIAGNOSIS — M25562 Pain in left knee: Secondary | ICD-10-CM

## 2022-02-13 DIAGNOSIS — S83281A Other tear of lateral meniscus, current injury, right knee, initial encounter: Secondary | ICD-10-CM | POA: Insufficient documentation

## 2022-02-13 NOTE — Progress Notes (Signed)
Office Visit Note   Patient: Wanda Bailey           Date of Birth: Dec 07, 1961           MRN: [id number] Visit Date: 02/13/2022              Requested by: Georganna Skeans, MD [address] [place],  [place] [address] PCP: Georganna Skeans, MD   Assessment & Plan: Visit Diagnoses:  1. Acute lateral meniscus tear of right knee, initial encounter     Plan: Patient returns today with interpreter to discuss MRI right knee.  Reports continued pain with daily activities.  Examination of right knee shows lateral joint line tenderness and pain with flexion of the knee past 40 degrees.  Pain with McMurray's test.  Collaterals and cruciates are stable.  MRI of the right knee shows a complex tear of the lateral meniscus of the anterior horn and body.  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  These findings were reviewed with the patient in detail with the help of the interpreter and based on these findings I recommend arthroscopic partial lateral meniscectomy.  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  I feel that the majority of her symptoms are coming from the torn meniscus.  Details of the surgery including risk benefits rehab recovery prognosis reviewed with the patient today.  Questions encouraged and answered.  Total face to face encounter time was greater than 25 minutes and over half of this time was spent in counseling and/or coordination of care.  Follow-Up Instructions: No follow-ups on file.   Orders:  No orders of the defined types were placed in this encounter.  No orders of the defined types were placed in this encounter.     Procedures: No procedures performed   Clinical Data: No additional findings.   Subjective: Chief Complaint  Patient presents  with   Right Knee - Follow-up    MRI review    HPI  Review of Systems  Constitutional: Negative.   HENT: Negative.    Eyes: Negative.   Respiratory: Negative.    Cardiovascular: Negative.   Endocrine: Negative.   Musculoskeletal: Negative.   Neurological: Negative.   Hematological: Negative.   Psychiatric/Behavioral: Negative.    All other systems reviewed and are negative.   Objective: Vital Signs: There were no vitals taken for this visit.  Physical Exam Vitals and nursing note reviewed.  Constitutional:      Appearance: She is well-developed.  Pulmonary:     Effort: Pulmonary effort is normal.  Skin:    General: Skin is warm.     Capillary Refill: Capillary refill takes less than 2 seconds.  Neurological:     Mental Status: She is alert and oriented to person, place, and time.  Psychiatric:        Behavior: Behavior normal.        Thought Content: Thought content normal.        Judgment: Judgment normal.    Ortho Exam  Specialty Comments:  No specialty comments available.  Imaging: No results found.   PMFS History: Patient Active Problem List   Diagnosis Date Noted   Acute lateral meniscus tear of right knee 02/13/2022   Past Medical History:  Diagnosis Date   Diabetes mellitus without complication (HCC)  Hyperlipidemia     No family history on file.  History reviewed. No pertinent surgical history. Social History   Occupational History   Not on file  Tobacco Use   Smoking status: Never   Smokeless tobacco: Never  Substance and Sexual Activity   Alcohol use: No   Drug use: Not on file   Sexual activity: Not on file

## 2022-03-06 ENCOUNTER — Encounter (HOSPITAL_BASED_OUTPATIENT_CLINIC_OR_DEPARTMENT_OTHER): Payer: Self-pay | Admitting: Orthopaedic Surgery

## 2022-03-06 ENCOUNTER — Other Ambulatory Visit: Payer: Self-pay

## 2022-03-08 ENCOUNTER — Encounter (HOSPITAL_BASED_OUTPATIENT_CLINIC_OR_DEPARTMENT_OTHER)
Admission: RE | Admit: 2022-03-08 | Discharge: 2022-03-08 | Disposition: A | Discharge status: Home / Self Care | Payer: Self-pay | Source: Ambulatory Visit | Attending: Orthopaedic Surgery | Admitting: Orthopaedic Surgery

## 2022-03-08 DIAGNOSIS — Z01818 Encounter for other preprocedural examination: Secondary | ICD-10-CM | POA: Insufficient documentation

## 2022-03-08 DIAGNOSIS — Z794 Long term (current) use of insulin: Secondary | ICD-10-CM | POA: Insufficient documentation

## 2022-03-08 DIAGNOSIS — E119 Type 2 diabetes mellitus without complications: Secondary | ICD-10-CM | POA: Insufficient documentation

## 2022-03-08 LAB — BASIC METABOLIC PANEL
Anion gap: 7 (ref 5–15)
BUN: 12 mg/dL (ref 6–20)
CO2: 26 mmol/L (ref 22–32)
Calcium: 9.2 mg/dL (ref 8.9–10.3)
Chloride: 106 mmol/L (ref 98–111)
Creatinine, Ser: 0.59 mg/dL (ref 0.44–1.00)
GFR, Estimated: >60 mL/min (ref >60)
Glucose, Bld: 112 mg/dL — ABNORMAL HIGH (ref 70–99)
Potassium: 4.4 mmol/L (ref 3.5–5.1)
Sodium: 139 mmol/L (ref 135–145)

## 2022-03-08 NOTE — Progress Notes (Signed)

## 2022-03-13 MED ORDER — LIDOCAINE-EPINEPHRINE (PF) 1 %-1:200000 IJ SOLN
INTRAMUSCULAR | Status: AC
  Filled 2022-03-13: qty 60

## 2022-03-13 MED ORDER — BUPIVACAINE-EPINEPHRINE (PF) 0.5% -1:200000 IJ SOLN
INTRAMUSCULAR | Status: AC
  Filled 2022-03-13: qty 90

## 2022-03-13 MED ORDER — LIDOCAINE HCL (PF) 1 % IJ SOLN
INTRAMUSCULAR | Status: AC
  Filled 2022-03-13: qty 60

## 2022-03-14 ENCOUNTER — Ambulatory Visit (HOSPITAL_BASED_OUTPATIENT_CLINIC_OR_DEPARTMENT_OTHER)
Admission: RE | Admit: 2022-03-14 | Discharge: 2022-03-14 | Disposition: A | Discharge status: Home / Self Care | Payer: Self-pay | Attending: Orthopaedic Surgery | Admitting: Orthopaedic Surgery

## 2022-03-14 ENCOUNTER — Ambulatory Visit (HOSPITAL_BASED_OUTPATIENT_CLINIC_OR_DEPARTMENT_OTHER): Payer: Self-pay | Admitting: Anesthesiology

## 2022-03-14 ENCOUNTER — Encounter (HOSPITAL_BASED_OUTPATIENT_CLINIC_OR_DEPARTMENT_OTHER): Payer: Self-pay | Admitting: Orthopaedic Surgery

## 2022-03-14 ENCOUNTER — Encounter: Payer: Self-pay | Admitting: Orthopaedic Surgery

## 2022-03-14 ENCOUNTER — Encounter (HOSPITAL_BASED_OUTPATIENT_CLINIC_OR_DEPARTMENT_OTHER)
Admission: RE | Disposition: A | Discharge status: Home / Self Care | Payer: Self-pay | Source: Home / Self Care | Attending: Orthopaedic Surgery

## 2022-03-14 ENCOUNTER — Other Ambulatory Visit: Payer: Self-pay

## 2022-03-14 DIAGNOSIS — Z6836 Body mass index (BMI) 36.0-36.9, adult: Secondary | ICD-10-CM | POA: Insufficient documentation

## 2022-03-14 DIAGNOSIS — S83281A Other tear of lateral meniscus, current injury, right knee, initial encounter: Secondary | ICD-10-CM

## 2022-03-14 DIAGNOSIS — Z794 Long term (current) use of insulin: Secondary | ICD-10-CM

## 2022-03-14 DIAGNOSIS — X58XXXA Exposure to other specified factors, initial encounter: Secondary | ICD-10-CM | POA: Insufficient documentation

## 2022-03-14 DIAGNOSIS — E669 Obesity, unspecified: Secondary | ICD-10-CM | POA: Insufficient documentation

## 2022-03-14 DIAGNOSIS — M94261 Chondromalacia, right knee: Secondary | ICD-10-CM

## 2022-03-14 DIAGNOSIS — E119 Type 2 diabetes mellitus without complications: Secondary | ICD-10-CM | POA: Insufficient documentation

## 2022-03-14 DIAGNOSIS — E785 Hyperlipidemia, unspecified: Secondary | ICD-10-CM | POA: Insufficient documentation

## 2022-03-14 HISTORY — PX: KNEE ARTHROSCOPY WITH MEDIAL MENISECTOMY: SHX5651

## 2022-03-14 HISTORY — DX: Other tear of lateral meniscus, current injury, right knee, initial encounter: S83.281A

## 2022-03-14 LAB — GLUCOSE, CAPILLARY
Glucose-Capillary: 97 mg/dL (ref 70–99)
Glucose-Capillary: 102 mg/dL — ABNORMAL HIGH (ref 70–99)

## 2022-03-14 SURGERY — ARTHROSCOPY, KNEE, WITH MEDIAL MENISCECTOMY
Anesthesia: General | Site: Knee | Laterality: Right

## 2022-03-14 MED ORDER — PROPOFOL 10 MG/ML IV BOLUS
INTRAVENOUS | Status: DC | PRN
  Administered 2022-03-14: 180 mg via INTRAVENOUS

## 2022-03-14 MED ORDER — BUPIVACAINE HCL (PF) 0.25 % IJ SOLN
INTRAMUSCULAR | Status: DC | PRN
  Administered 2022-03-14: 20 mL

## 2022-03-14 MED ORDER — MIDAZOLAM HCL 2 MG/2ML IJ SOLN
INTRAMUSCULAR | Status: AC
Start: 2022-03-14 — End: ?
  Filled 2022-03-14: qty 2

## 2022-03-14 MED ORDER — ACETAMINOPHEN 500 MG PO TABS
ORAL_TABLET | ORAL | Status: AC
  Filled 2022-03-14: qty 2

## 2022-03-14 MED ORDER — OXYCODONE HCL 5 MG PO TABS
5.0000 mg | ORAL_TABLET | Freq: Once | ORAL | Status: AC | PRN
  Administered 2022-03-14: 5 mg via ORAL

## 2022-03-14 MED ORDER — LACTATED RINGERS IV SOLN: INTRAVENOUS | Status: DC

## 2022-03-14 MED ORDER — OXYCODONE HCL 5 MG/5ML PO SOLN: 5.0000 mg | Freq: Once | ORAL | Status: AC | PRN

## 2022-03-14 MED ORDER — AMISULPRIDE (ANTIEMETIC) 5 MG/2ML IV SOLN: 10.0000 mg | Freq: Once | INTRAVENOUS | Status: DC | PRN

## 2022-03-14 MED ORDER — ONDANSETRON HCL 4 MG/2ML IJ SOLN
INTRAMUSCULAR | Status: DC | PRN
Start: 2022-03-14 — End: 2022-03-14
  Administered 2022-03-14: 4 mg via INTRAVENOUS

## 2022-03-14 MED ORDER — SODIUM CHLORIDE 0.9 % IR SOLN
Status: DC | PRN
  Administered 2022-03-14: 6000 mL

## 2022-03-14 MED ORDER — OXYCODONE HCL 5 MG PO TABS
ORAL_TABLET | ORAL | Status: AC
  Filled 2022-03-14: qty 1

## 2022-03-14 MED ORDER — MIDAZOLAM HCL 5 MG/5ML IJ SOLN
INTRAMUSCULAR | Status: DC | PRN
  Administered 2022-03-14: 2 mg via INTRAVENOUS

## 2022-03-14 MED ORDER — HYDROCODONE-ACETAMINOPHEN 5-325 MG PO TABS: 1.0000 | ORAL_TABLET | Freq: Four times a day (QID) | ORAL | 0 refills | Status: AC | PRN

## 2022-03-14 MED ORDER — ONDANSETRON HCL 4 MG/2ML IJ SOLN
INTRAMUSCULAR | Status: AC
  Filled 2022-03-14: qty 2

## 2022-03-14 MED ORDER — FENTANYL CITRATE (PF) 100 MCG/2ML IJ SOLN
INTRAMUSCULAR | Status: AC
Start: 2022-03-14 — End: ?
  Filled 2022-03-14: qty 2

## 2022-03-14 MED ORDER — FENTANYL CITRATE (PF) 100 MCG/2ML IJ SOLN
INTRAMUSCULAR | Status: AC
  Filled 2022-03-14: qty 2

## 2022-03-14 MED ORDER — ONDANSETRON HCL 4 MG/2ML IJ SOLN: 4.0000 mg | Freq: Once | INTRAMUSCULAR | Status: DC | PRN

## 2022-03-14 MED ORDER — CEFAZOLIN SODIUM-DEXTROSE 2-4 GM/100ML-% IV SOLN
2.0000 g | INTRAVENOUS | Status: AC
  Administered 2022-03-14: 2 g via INTRAVENOUS

## 2022-03-14 MED ORDER — DEXAMETHASONE SODIUM PHOSPHATE 10 MG/ML IJ SOLN
INTRAMUSCULAR | Status: AC
  Filled 2022-03-14: qty 1

## 2022-03-14 MED ORDER — LIDOCAINE 2% (20 MG/ML) 5 ML SYRINGE
INTRAMUSCULAR | Status: AC
  Filled 2022-03-14: qty 5

## 2022-03-14 MED ORDER — PROPOFOL 10 MG/ML IV BOLUS
INTRAVENOUS | Status: AC
  Filled 2022-03-14: qty 20

## 2022-03-14 MED ORDER — LIDOCAINE HCL (CARDIAC) PF 100 MG/5ML IV SOSY
PREFILLED_SYRINGE | INTRAVENOUS | Status: DC | PRN
  Administered 2022-03-14: 60 mg via INTRATRACHEAL

## 2022-03-14 MED ORDER — ACETAMINOPHEN 500 MG PO TABS
1000.0000 mg | ORAL_TABLET | Freq: Once | ORAL | Status: AC
  Administered 2022-03-14: 1000 mg via ORAL

## 2022-03-14 MED ORDER — FENTANYL CITRATE (PF) 100 MCG/2ML IJ SOLN
25.0000 ug | INTRAMUSCULAR | Status: DC | PRN
  Administered 2022-03-14: 25 ug via INTRAVENOUS

## 2022-03-14 MED ORDER — FENTANYL CITRATE (PF) 100 MCG/2ML IJ SOLN
INTRAMUSCULAR | Status: DC | PRN
  Administered 2022-03-14 (×2): 25 ug via INTRAVENOUS
  Administered 2022-03-14: 50 ug via INTRAVENOUS

## 2022-03-14 MED ORDER — CEFAZOLIN SODIUM-DEXTROSE 2-4 GM/100ML-% IV SOLN
INTRAVENOUS | Status: AC
  Filled 2022-03-14: qty 100

## 2022-03-14 MED ORDER — DEXAMETHASONE SODIUM PHOSPHATE 10 MG/ML IJ SOLN
INTRAMUSCULAR | Status: DC | PRN
  Administered 2022-03-14: 10 mg via INTRAVENOUS

## 2022-03-14 SURGICAL SUPPLY — 36 items
BANDAGE ESMARK 6X9 LF (GAUZE/BANDAGES/DRESSINGS) IMPLANT
BLADE EXCALIBUR 4.0X13 (MISCELLANEOUS) ×3 IMPLANT
BLADE SHAVER TORPEDO 4X13 (MISCELLANEOUS) IMPLANT
BNDG CMPR 9X6 STRL LF SNTH (GAUZE/BANDAGES/DRESSINGS)
BNDG ELASTIC 6X5.8 VLCR STR LF (GAUZE/BANDAGES/DRESSINGS) ×6 IMPLANT
BNDG ESMARK 6X9 LF (GAUZE/BANDAGES/DRESSINGS)
BURR OVAL 8 FLU 4.0X13 (MISCELLANEOUS) IMPLANT
COOLER ICEMAN CLASSIC (MISCELLANEOUS) ×3 IMPLANT
CUFF TOURN SGL QUICK 34 (TOURNIQUET CUFF) ×2
CUFF TRNQT CYL 34X4.125X (TOURNIQUET CUFF) ×2 IMPLANT
CUTTER BONE 4.0MM X 13CM (MISCELLANEOUS) IMPLANT
DISSECTOR 3.5MM X 13CM CVD (MISCELLANEOUS) IMPLANT
DRAPE ARTHROSCOPY W/POUCH 90 (DRAPES) ×3 IMPLANT
DRAPE IMP U-DRAPE 54X76 (DRAPES) ×3 IMPLANT
DRAPE U-SHAPE 47X51 STRL (DRAPES) ×3 IMPLANT
DURAPREP 26ML APPLICATOR (WOUND CARE) ×6 IMPLANT
EXCALIBUR 3.8MM X 13CM (MISCELLANEOUS) IMPLANT
GAUZE SPONGE 4X4 12PLY STRL (GAUZE/BANDAGES/DRESSINGS) ×3 IMPLANT
GAUZE XEROFORM 1X8 LF (GAUZE/BANDAGES/DRESSINGS) ×3 IMPLANT
GLOVE SRG 8 PF TXTR STRL LF DI (GLOVE) ×2 IMPLANT
GLOVE SURG SYN 7.5  E (GLOVE) ×2
GLOVE SURG SYN 7.5 E (GLOVE) ×1 IMPLANT
GLOVE SURG SYN 7.5 PF PI (GLOVE) ×2 IMPLANT
GLOVE SURG UNDER POLY LF SZ7 (GLOVE) ×7 IMPLANT
GLOVE SURG UNDER POLY LF SZ8 (GLOVE) ×2
GOWN STRL REIN XL XLG (GOWN DISPOSABLE) ×6 IMPLANT
GOWN STRL REUS W/ TWL LRG LVL3 (GOWN DISPOSABLE) ×2 IMPLANT
GOWN STRL REUS W/TWL LRG LVL3 (GOWN DISPOSABLE) ×4
MANIFOLD NEPTUNE II (INSTRUMENTS) ×3 IMPLANT
PACK ARTHROSCOPY DSU (CUSTOM PROCEDURE TRAY) ×3 IMPLANT
PACK BASIN DAY SURGERY FS (CUSTOM PROCEDURE TRAY) ×3 IMPLANT
PAD COLD SHLDR WRAP-ON (PAD) ×3 IMPLANT
SHEET MEDIUM DRAPE 40X70 STRL (DRAPES) ×3 IMPLANT
SUT ETHILON 3 0 PS 1 (SUTURE) ×4 IMPLANT
TOWEL GREEN STERILE FF (TOWEL DISPOSABLE) ×3 IMPLANT
TUBING ARTHROSCOPY IRRIG 16FT (MISCELLANEOUS) ×3 IMPLANT

## 2022-03-14 NOTE — Anesthesia Procedure Notes (Signed)
Procedure Name: LMA Insertion ?Date/Time: 03/14/2022 12:54 PM ?Performed by: Thornell Mule, CRNA ?Pre-anesthesia Checklist: Patient identified, Emergency Drugs available, Suction available and Patient being monitored ?Patient Re-evaluated:Patient Re-evaluated prior to induction ?Oxygen Delivery Method: Circle system utilized ?Preoxygenation: Pre-oxygenation with 100% oxygen ?Induction Type: IV induction ?LMA: LMA inserted ?LMA Size: 4.0 ?Number of attempts: 1 ?Placement Confirmation: positive ETCO2 ?Tube secured with: Tape ?Dental Injury: Teeth and Oropharynx as per pre-operative assessment  ? ? ? ? ?

## 2022-03-14 NOTE — Discharge Instructions (Addendum)
? ? ?Post-operative patient instructions  ?Knee Arthroscopy  ? ?Ice:  Place intermittent ice or cooler pack over your knee, 30 minutes on and 30 minutes off.  Continue this for the first 72 hours after surgery, then save ice for use after therapy sessions or on more active days.   ?Weight:  You may bear weight on your leg as your symptoms allow. ?Crutches:  Use crutches (or walker) to assist in walking until told to discontinue by your physical therapist or physician. This will help to reduce pain. ?Strengthening:  Perform simple thigh squeezes (isometric quad contractions) and straight leg lifts as you are able (3 sets of 5 to 10 repetitions, 3 times a day).  For the leg lifts, have someone support under your ankle in the beginning until you have increased strength enough to do this on your own.  To help get started on thigh squeezes, place a pillow under your knee and push down on the pillow with back of knee (sometimes easier to do than with your leg fully straight). ?Motion:  Perform gentle knee motion as tolerated - this is gentle bending and straightening of the knee. Seated heel slides: you can start by sitting in a chair, remove your brace, and gently slide your heel back on the floor - allowing your knee to bend. Have someone help you straighten your knee (or use your other leg/foot hooked under your ankle.  ?Dressing:  Perform 1st dressing change at 2 days postoperative. A moderate amount of blood tinged drainage is to be expected.  So if you bleed through the dressing on the first or second day or if you have fevers, it is fine to change the dressing/check the wounds early and redress wound. Elevate your leg.  If it bleeds through again, or if the incisions are leaking frank blood, please call the office. May change dressing every 1-2 days thereafter to help watch wounds. Can purchase Tegaderm (or 41M Nexcare) water resistant dressings at local pharmacy / [facility]. ?Shower:  Light shower is ok after 2  days.  Please take shower, NO bath. Recover with gauze and ace wrap to help keep wounds protected.   ?Pain medication:  A narcotic pain medication has been prescribed.  Take as directed.  Typically you need narcotic pain medication more regularly during the first 3 to 5 days after surgery.  Decrease your use of the medication as the pain improves.  Narcotics can sometimes cause constipation, even after a few doses.  If you have problems with constipation, you can take an over the counter stool softener or light laxative.  If you have persistent problems, please notify your physician?s office. ?Physical therapy: Additional activity guidelines to be provided by your physician or physical therapist at follow-up visits.  ?Driving: Do not recommend driving x 2 weeks post surgical, especially if surgery performed on right side. Should not drive while taking narcotic pain medications. It typically takes at least 2 weeks to restore sufficient neuromuscular function for normal reaction times for driving safety.  ?Call [phone] for questions or problems. Evenings you will be forwarded to the hospital operator.  Ask for the orthopaedic physician on call. Please call if you experience:  ?  ?Redness, foul smelling, or persistent drainage from the surgical site  ?worsening knee pain and swelling not responsive to medication  ?any calf pain and or swelling of the lower leg  ?temperatures greater than 101.5 F ?other questions or concerns ? ? ?Thank you for allowing [place] to be  a part of your care. ?May take Tylenol after 6pm, if needed.  ? ? ?Post Anesthesia Home Care Instructions ? ?Activity: ?Get plenty of rest for the remainder of the day. A responsible individual must stay with you for 24 hours following the procedure.  ?For the next 24 hours, DO NOT: ?-Drive a car ?-Advertising copywriter ?-Drink alcoholic beverages ?-Take any medication unless instructed by your physician ?-Make any legal decisions or sign important  papers. ? ?Meals: ?Start with liquid foods such as gelatin or soup. Progress to regular foods as tolerated. Avoid greasy, spicy, heavy foods. If nausea and/or vomiting occur, drink only clear liquids until the nausea and/or vomiting subsides. Call your physician if vomiting continues. ? ?Special Instructions/Symptoms: ?Your throat may feel dry or sore from the anesthesia or the breathing tube placed in your throat during surgery. If this causes discomfort, gargle with warm salt water. The discomfort should disappear within 24 hours. ? ?If you had a scopolamine patch placed behind your ear for the management of post- operative nausea and/or vomiting: ? ?1. The medication in the patch is effective for 72 hours, after which it should be removed.  Wrap patch in a tissue and discard in the trash. Wash hands thoroughly with soap and water. ?2. You may remove the patch earlier than 72 hours if you experience unpleasant side effects which may include dry mouth, dizziness or visual disturbances. ?3. Avoid touching the patch. Wash your hands with soap and water after contact with the patch. ?    ?

## 2022-03-14 NOTE — Transfer of Care (Signed)
Immediate Anesthesia Transfer of Care Note ? ?Patient: Wanda Bailey ? ?Procedure(s) Performed: right knee arthroscopy partial lateral meniscectomy (Right: Knee) ? ?Patient Location: PACU ? ?Anesthesia Type:General ? ?Level of Consciousness: drowsy and patient cooperative ? ?Airway & Oxygen Therapy: Patient Spontanous Breathing and Patient connected to face mask oxygen ? ?Post-op Assessment: Report given to RN and Post -op Vital signs reviewed and stable ? ?Post vital signs: Reviewed and stable ? ?Last Vitals:  ?Vitals Value Taken Time  ?BP 151/78 03/14/22 1346  ?Temp    ?Pulse 73 03/14/22 1347  ?Resp 13 03/14/22 1347  ?SpO2 100 % 03/14/22 1347  ?Vitals shown include unvalidated device data. ? ?Last Pain:  ?Vitals:  ? 03/14/22 1143  ?TempSrc: Oral  ?PainSc: 0-No pain  ?   ? ?Patients Stated Pain Goal: 4 (03/14/22 1143) ? ?Complications: No notable events documented. ?

## 2022-03-14 NOTE — Anesthesia Preprocedure Evaluation (Addendum)
Anesthesia Evaluation  ?Patient identified by MRN, date of birth, ID band ?Patient awake ? ? ? ?Reviewed: ?Allergy & Precautions, NPO status , Patient's Chart, lab work & pertinent test results ? ?Airway ?Mallampati: II ? ?TM Distance: >3 FB ?Neck ROM: Full ? ? ? Dental ?no notable dental hx. ? ?  ?Pulmonary ?neg pulmonary ROS,  ?  ?Pulmonary exam normal ?breath sounds clear to auscultation ? ? ? ? ? ? Cardiovascular ?Exercise Tolerance: Good ?negative cardio ROS ?Normal cardiovascular exam ?Rhythm:Regular Rate:Normal ? ? ?  ?Neuro/Psych ?negative neurological ROS ? negative psych ROS  ? GI/Hepatic ?negative GI ROS, Neg liver ROS,   ?Endo/Other  ?diabetes, Type 2, Oral Hypoglycemic AgentsHyperlipidemia ?obesity ? Renal/GU ?negative Renal ROS  ?negative genitourinary ?  ?Musculoskeletal ?negative musculoskeletal ROS ?(+)  ? Abdominal ?  ?Peds ?negative pediatric ROS ?(+)  Hematology ?negative hematology ROS ?(+)   ?Anesthesia Other Findings ? ? Reproductive/Obstetrics ?negative OB ROS ? ?  ? ? ? ? ? ? ? ? ? ? ? ? ? ?  ?  ? ? ? ? ? ? ? ? ?Anesthesia Physical ?Anesthesia Plan ? ?ASA: 2 ? ?Anesthesia Plan: General  ? ?Post-op Pain Management: Tylenol PO (pre-op)*  ? ?Induction: Intravenous ? ?PONV Risk Score and Plan: 3 and Treatment may vary due to age or medical condition, Midazolam, Ondansetron and Dexamethasone ? ?Airway Management Planned:  ? ?Additional Equipment: None ? ?Intra-op Plan:  ? ?Post-operative Plan: Extubation in OR ? ?Informed Consent: I have reviewed the patients History and Physical, chart, labs and discussed the procedure including the risks, benefits and alternatives for the proposed anesthesia with the patient or authorized representative who has indicated his/her understanding and acceptance.  ? ? ? ?Dental advisory given and Interpreter used for interveiw ? ?Plan Discussed with: CRNA and Anesthesiologist ? ?Anesthesia Plan Comments:   ? ? ? ? ? ? ?Anesthesia  Quick Evaluation ? ?

## 2022-03-14 NOTE — H&P (Signed)
? ? ?  PREOPERATIVE H&P ? ?Chief Complaint: right knee lateral meniscal tear ? ?HPI: ?Wanda Bailey is a 61 y.o. female who presents for surgical treatment of right knee lateral meniscal tear.  She denies any changes in medical history. ? ?Past Medical History:  ?Diagnosis Date  ? Acute lateral meniscus tear of right knee   ? Diabetes mellitus without complication ([place])   ? Hyperlipidemia   ? ?History reviewed. No pertinent surgical history. ?Social History  ? ?Socioeconomic History  ? Marital status: Married  ?  Spouse name: Not on file  ? Number of children: Not on file  ? Years of education: Not on file  ? Highest education level: Not on file  ?Occupational History  ? Not on file  ?Tobacco Use  ? Smoking status: Never  ? Smokeless tobacco: Never  ?Substance and Sexual Activity  ? Alcohol use: No  ? Drug use: Never  ? Sexual activity: Not Currently  ?  Birth control/protection: Post-menopausal  ?Other Topics Concern  ? Not on file  ?Social History Narrative  ? Not on file  ? ?Social Determinants of Health  ? ?Financial Resource Strain: Not on file  ?Food Insecurity: Not on file  ?Transportation Needs: Not on file  ?Physical Activity: Not on file  ?Stress: Not on file  ?Social Connections: Not on file  ? ?History reviewed. No pertinent family history. ?No Known Allergies ?Prior to Admission medications   ?Medication Sig Start Date End Date Taking? Authorizing Provider  ?atorvastatin (LIPITOR) 20 MG tablet Take 1 tablet (20 mg total) by mouth daily. 01/26/22  Yes Dorna Mai, MD  ?calcium carbonate (OS-CAL - DOSED IN MG OF ELEMENTAL CALCIUM) 1250 (500 Ca) MG tablet Take 1 tablet by mouth.   Yes [provider]  ?cholecalciferol (VITAMIN D3) 25 MCG (1000 UNIT) tablet Take 1,000 Units by mouth daily.   Yes [provider]  ?metFORMIN (GLUCOPHAGE) 500 MG tablet Take 1 tablet (500 mg total) by mouth 2 (two) times daily with a meal. 11/13/21  Yes Argentina Donovan, PA-C  ? ? ? ?Positive ROS:  All other systems have been reviewed and were otherwise negative with the exception of those mentioned in the HPI and as above. ? ?Physical Exam: ?General: Alert, no acute distress ?Cardiovascular: No pedal edema ?Respiratory: No cyanosis, no use of accessory musculature ?GI: abdomen soft ?Skin: No lesions in the area of chief complaint ?Neurologic: Sensation intact distally ?Psychiatric: Patient is competent for consent with normal mood and affect ?Lymphatic: no lymphedema ? ?MUSCULOSKELETAL: exam stable ? ?Assessment: ?right knee lateral meniscal tear ? ?Plan: ?Plan for Procedure(s): ?right knee arthroscopy partial lateral meniscectomy ? ?The risks benefits and alternatives were discussed with the patient including but not limited to the risks of nonoperative treatment, versus surgical intervention including infection, bleeding, nerve injury,  blood clots, cardiopulmonary complications, morbidity, mortality, among others, and they were willing to proceed.  ? ? ? ?Eduard Roux, MD ?03/14/2022 ?12:17 PM ? ?

## 2022-03-14 NOTE — Op Note (Signed)
? ?  Surgery Date: 03/14/2022 ? ?PREOPERATIVE DIAGNOSES:  ?1. Right knee lateral meniscus tear ?2. Right knee chondromalacia ? ?POSTOPERATIVE DIAGNOSES:  ?same ? ?PROCEDURES PERFORMED:  ?1. Right knee arthroscopy with limited synovectomy ?2. Right knee arthroscopy with arthroscopic partial lateral meniscectomy ?3. Right knee arthroscopy with arthroscopic chondroplasty lateral femoral condyle  ? ?SURGEON: N. Eduard Roux, M.D. ? ?ASSIST: Madalyn Rob, PA-C; necessary for the timely completion of procedure and due to complexity of procedure. ? ?ANESTHESIA:  general ? ?FLUIDS: Per anesthesia record.  ? ?ESTIMATED BLOOD LOSS: minimal ? ?DESCRIPTION OF PROCEDURE: Ms. Wanda Bailey is a 61 y.o.-year-old female with above mentioned conditions. Full discussion held regarding risks benefits alternatives and complications related surgical intervention. Conservative care options reviewed. All questions answered. ? ?The patient was identified in the preoperative holding area and the operative extremity was marked. The patient was brought to the operating room and transferred to operating table in a supine position. Satisfactory general anesthesia was induced by anesthesiology.   ? ?Standard anterolateral, anteromedial arthroscopy portals were obtained. The anteromedial portal was obtained with a spinal needle for localization under direct visualization with subsequent diagnostic findings.  ? ?Medial compartment showed grade I chondromalacia about the femoral head and tibial surfaces.  The medial meniscus was unremarkable.  Cruciates were unremarkable.  The knee was then placed in figure-of-four position.  Complex tear of the anterior horn mid body of the lateral meniscus was identified that corresponded to the preoperative MRI.  Partial lateral meniscectomy was performed using oscillating shaver.  The majority of the volume of the lateral meniscus was resected in order to fully treat the tear.  Gentle chondroplasty was  performed of the lateral femoral condyle back to stable margins.  Patellofemoral compartment showed mild synovitis with grade 2 changes. ? ?Gutters were checked for loose bodies.  Excess fluid was removed from the knee joint.  Incisions were closed with interrupted nylon sutures.  Sterile dressings were applied.  Patient tolerated procedure well had no immediate complications. ? ?Suprapatellar pouch and gutters: mild synovitis or debris. ?Patella chondral surface: Grade 2 ?Trochlear chondral surface: Grade 2 ?Patellofemoral tracking: normal ?Medial meniscus: normal.  ?Medial femoral condyle weight bearing surface: Grade 1 ?Medial tibial plateau: Grade 1 ?Anterior cruciate ligament:stable ?Posterior cruciate ligament:stable ?Lateral meniscus: complex tear anterior horn and midbody.   ?Lateral femoral condyle weight bearing surface: Grade 3-4 ?Lateral tibial plateau: Grade 3 ? ?DISPOSITION: The patient was awakened from general anesthetic, extubated, taken to the recovery room in medically stable condition, no apparent complications. The patient may be weightbearing as tolerated to the operative lower extremity.  Range of motion of right knee as tolerated. ? ?N. Eduard Roux, MD ?Wanda Bailey ?1:34 PM ?

## 2022-03-14 NOTE — Anesthesia Postprocedure Evaluation (Signed)
Anesthesia Post Note ? ?Patient: Nicolet Nega ? ?Procedure(s) Performed: right knee arthroscopy partial lateral meniscectomy (Right: Knee) ? ?  ? ?Patient location during evaluation: PACU ?Anesthesia Type: General ?Level of consciousness: awake ?Pain management: pain level controlled ?Vital Signs Assessment: post-procedure vital signs reviewed and stable ?Respiratory status: spontaneous breathing and respiratory function stable ?Cardiovascular status: stable ?Postop Assessment: no apparent nausea or vomiting ?Anesthetic complications: no ? ? ?No notable events documented. ? ?Last Vitals:  ?Vitals:  ? 03/14/22 1400 03/14/22 1414  ?BP: (!) 165/82 (!) 170/79  ?Pulse: 70 66  ?Resp: 12 12  ?Temp:    ?SpO2: 100% 98%  ?  ?Last Pain:  ?Vitals:  ? 03/14/22 1414  ?TempSrc:   ?PainSc: Asleep  ? ? ?  ?  ?  ?  ?  ?  ? ?Merlinda Frederick ? ? ? ? ?

## 2022-03-15 ENCOUNTER — Encounter (HOSPITAL_BASED_OUTPATIENT_CLINIC_OR_DEPARTMENT_OTHER): Payer: Self-pay | Admitting: Orthopaedic Surgery

## 2022-03-21 ENCOUNTER — Encounter: Payer: Self-pay | Admitting: Orthopaedic Surgery

## 2022-03-21 ENCOUNTER — Ambulatory Visit (INDEPENDENT_AMBULATORY_CARE_PROVIDER_SITE_OTHER): Payer: Self-pay | Admitting: Orthopaedic Surgery

## 2022-03-21 DIAGNOSIS — S83281A Other tear of lateral meniscus, current injury, right knee, initial encounter: Secondary | ICD-10-CM

## 2022-03-21 NOTE — Progress Notes (Signed)
? ?  Post-Op Visit Note ?  ?Patient: Wanda Bailey           ?Date of Birth: 03-Jul-1961           ?MRN: [id number] ?Visit Date: 03/21/2022 ?PCP: Georganna Skeans, MD ? ? ?Assessment & Plan: ? ?Chief Complaint:  ?Chief Complaint  ?Patient presents with  ? Right Knee - Routine Post Op  ? ?Visit Diagnoses:  ?1. Acute lateral meniscus tear of right knee, initial encounter   ? ? ?Plan: Patient is 1 week status post right knee arthroscopy partial lateral meniscectomy.  Overall doing well has minimal pain.  She states the preoperative pain has resolved.  She lacks full extension. ? ?Examination of the right knee shows healed surgical incisions.  Mild bruising around the portal sites.  No signs of infection.  Range of motion is appropriate. ? ?We provided home exercises for her.  Sutures removed.  She will gradually increase activity as tolerated.  Recheck in 4 weeks.  Language interpreter present today. ? ?Follow-Up Instructions: Return in about 4 weeks (around 04/18/2022).  ? ?Orders:  ?No orders of the defined types were placed in this encounter. ? ?No orders of the defined types were placed in this encounter. ? ? ?Imaging: ?No results found. ? ?PMFS History: ?Patient Active Problem List  ? Diagnosis Date Noted  ? Acute lateral meniscus tear of right knee 02/13/2022  ? ?Past Medical History:  ?Diagnosis Date  ? Acute lateral meniscus tear of right knee   ? Diabetes mellitus without complication ([facility])   ? Hyperlipidemia   ?  ?History reviewed. No pertinent family history.  ?Past Surgical History:  ?Procedure Laterality Date  ? KNEE ARTHROSCOPY WITH MEDIAL MENISECTOMY Right 03/14/2022  ? Procedure: right knee arthroscopy partial lateral meniscectomy;  Surgeon: Tarry Kos, MD;  Location: [facility];  Service: Orthopedics;  Laterality: Right;  ? ?Social History  ? ?Occupational History  ? Not on file  ?Tobacco Use  ? Smoking status: Never  ? Smokeless tobacco: Never  ?Substance and Sexual Activity  ?  Alcohol use: No  ? Drug use: Never  ? Sexual activity: Not Currently  ?  Birth control/protection: Post-menopausal  ? ? ? ?

## 2022-03-28 ENCOUNTER — Encounter: Payer: Self-pay | Admitting: Family Medicine

## 2022-03-28 ENCOUNTER — Ambulatory Visit (INDEPENDENT_AMBULATORY_CARE_PROVIDER_SITE_OTHER): Payer: Self-pay | Admitting: Family Medicine

## 2022-03-28 VITALS — BP 118/79 | HR 68 | Temp 98.1°F | Resp 16 | Wt 207.6 lb

## 2022-03-28 DIAGNOSIS — R7309 Other abnormal glucose: Secondary | ICD-10-CM

## 2022-03-28 DIAGNOSIS — E7800 Pure hypercholesterolemia, unspecified: Secondary | ICD-10-CM

## 2022-03-28 DIAGNOSIS — K0889 Other specified disorders of teeth and supporting structures: Secondary | ICD-10-CM

## 2022-03-28 DIAGNOSIS — Z789 Other specified health status: Secondary | ICD-10-CM

## 2022-03-28 DIAGNOSIS — R519 Headache, unspecified: Secondary | ICD-10-CM

## 2022-03-28 NOTE — Progress Notes (Signed)
Patient is here to get a referral for dental pain. Patient c/o headaches that she has been having at night ?Patient is here for F/u DMII ?

## 2022-03-29 ENCOUNTER — Encounter: Payer: Self-pay | Admitting: Family Medicine

## 2022-03-29 LAB — LIPID PANEL
Chol/HDL Ratio: 5.6 ratio — ABNORMAL HIGH (ref 0.0–4.4)
Cholesterol, Total: 308 mg/dL — ABNORMAL HIGH (ref 100–199)
HDL: 55 mg/dL (ref >39)
LDL Chol Calc (NIH): 199 mg/dL — ABNORMAL HIGH (ref 0–99)
Triglycerides: 272 mg/dL — ABNORMAL HIGH (ref 0–149)
VLDL Cholesterol Cal: 54 mg/dL — ABNORMAL HIGH (ref 5–40)

## 2022-03-29 LAB — ALT: ALT: 19 IU/L (ref 0–32)

## 2022-03-29 LAB — AST: AST: 18 IU/L (ref 0–40)

## 2022-03-29 NOTE — Progress Notes (Signed)
? ?Established Patient Office Visit ? ?Subjective:  ?Patient ID: Wanda Bailey, female    DOB: 04-26-1961  Age: 61 y.o. MRN: [id number] ? ?CC:  ?Chief Complaint  ?Patient presents with  ? Dental Pain  ? ? ?HPI ?Wanda Bailey presents for follow up of chronic med issues. She also reports that she has had dental pain for a few weeks and wants a referral to a dentist. She also reports intermittent headaches. This visit was aided by and interpreter.  ? ?Past Medical History:  ?Diagnosis Date  ? Acute lateral meniscus tear of right knee   ? Diabetes mellitus without complication (HCC)   ? Hyperlipidemia   ? ? ?Past Surgical History:  ?Procedure Laterality Date  ? KNEE ARTHROSCOPY WITH MEDIAL MENISECTOMY Right 03/14/2022  ? Procedure: right knee arthroscopy partial lateral meniscectomy;  Surgeon: Tarry Kos, MD;  Location: [facility];  Service: Orthopedics;  Laterality: Right;  ? ? ?History reviewed. No pertinent family history. ? ?Social History  ? ?Socioeconomic History  ? Marital status: Married  ?  Spouse name: Not on file  ? Number of children: Not on file  ? Years of education: Not on file  ? Highest education level: Not on file  ?Occupational History  ? Not on file  ?Tobacco Use  ? Smoking status: Never  ? Smokeless tobacco: Never  ?Substance and Sexual Activity  ? Alcohol use: No  ? Drug use: Never  ? Sexual activity: Not Currently  ?  Birth control/protection: Post-menopausal  ?Other Topics Concern  ? Not on file  ?Social History Narrative  ? Not on file  ? ?Social Determinants of Health  ? ?Financial Resource Strain: Not on file  ?Food Insecurity: Not on file  ?Transportation Needs: Not on file  ?Physical Activity: Not on file  ?Stress: Not on file  ?Social Connections: Not on file  ?Intimate Partner Violence: Not on file  ? ? ?ROS ?Review of Systems  ?Constitutional:  Negative for fever.  ?HENT:  Positive for dental problem.   ?Neurological:  Positive for headaches.  ?All  other systems reviewed and are negative. ? ?Objective:  ? ?Today's Vitals: BP 118/79   Pulse 68   Temp 98.1 ?F (36.7 ?C) (Oral)   Resp 16   Wt 207 lb 9.6 oz (94.2 kg)   SpO2 95%   BMI 37.97 kg/m?  ? ?Physical Exam ?Vitals and nursing note reviewed.  ?Constitutional:   ?   General: She is not in acute distress. ?Cardiovascular:  ?   Rate and Rhythm: Normal rate and regular rhythm.  ?Pulmonary:  ?   Effort: Pulmonary effort is normal.  ?   Breath sounds: Normal breath sounds.  ?Abdominal:  ?   Palpations: Abdomen is soft.  ?   Tenderness: There is no abdominal tenderness.  ?Neurological:  ?   General: No focal deficit present.  ?   Mental Status: She is alert and oriented to person, place, and time.  ? ? ?Assessment & Plan:  ? ?1. Elevated glucose ?Continue present management. monitor ? ?2. Elevated cholesterol ?Monitoring labs ordered. Continue present management ?- Lipid Panel ?- AST ?- ALT ? ?3. Nonintractable headache, unspecified chronicity pattern, unspecified headache type ?Tylenol/nsaids prn ? ?4. Pain, dental ?Given info re: low cost dental services available ? ?5. Language barrier to communication ?  ? ? ?Outpatient Encounter Medications as of 03/28/2022  ?Medication Sig  ? atorvastatin (LIPITOR) 20 MG tablet Take 1 tablet (20 mg total) by  mouth daily.  ? calcium carbonate (OS-CAL - DOSED IN MG OF ELEMENTAL CALCIUM) 1250 (500 Ca) MG tablet Take 1 tablet by mouth.  ? cholecalciferol (VITAMIN D3) 25 MCG (1000 UNIT) tablet Take 1,000 Units by mouth daily.  ? HYDROcodone-acetaminophen (NORCO) 5-325 MG tablet Take 1 tablet by mouth every 6 (six) hours as needed.  ? metFORMIN (GLUCOPHAGE) 500 MG tablet Take 1 tablet (500 mg total) by mouth 2 (two) times daily with a meal.  ? ?No facility-administered encounter medications on file as of 03/28/2022.  ? ? ?Follow-up: No follow-ups on file.  ? ?[name], MD ? ?

## 2022-04-03 ENCOUNTER — Other Ambulatory Visit: Payer: Self-pay | Admitting: Family Medicine

## 2022-04-03 MED ORDER — ATORVASTATIN CALCIUM 40 MG PO TABS: 40.0000 mg | ORAL_TABLET | Freq: Every day | ORAL | 1 refills | Status: DC

## 2022-04-18 ENCOUNTER — Ambulatory Visit (INDEPENDENT_AMBULATORY_CARE_PROVIDER_SITE_OTHER): Payer: Self-pay | Admitting: Physician Assistant

## 2022-04-18 DIAGNOSIS — Z9889 Other specified postprocedural states: Secondary | ICD-10-CM

## 2022-04-18 NOTE — Progress Notes (Signed)
? ?  Post-Op Visit Note ?  ?Patient: Wanda Bailey           ?Date of Birth: 06-Nov-1961           ?MRN: [id number] ?Visit Date: 04/18/2022 ?PCP: Dorna Mai, MD ? ? ?Assessment & Plan: ? ?Chief Complaint:  ?Chief Complaint  ?Patient presents with  ? Right Knee - Routine Post Op, Follow-up  ? ?Visit Diagnoses:  ?1. S/P arthroscopy of right knee   ? ? ?Plan: Patient is a pleasant 61 year old Spanish-speaking female is here today with interpreter.  She is approximately 6 weeks status post right knee arthroscopic debridement lateral meniscus and chondroplasty, date of surgery 03/14/2022.  It was noted during operative intervention that she had grade 3 and 4 changes to the lateral compartment with grade 2 changes to the medial patellofemoral compartments.  She has been doing relatively well.  She is still having slight discomfort primarily with ambulating as well as when she is lying down at night.  Overall, she feels as though she is improving.  Examination of the right knee reveals a moderate-sized effusion.  Calf soft nontender.  Range of motion 5 to 120 degrees.  She is neurovascular intact distally.  At this point, I discussed that her symptoms appear to be coming from the underlying arthritis.  She may continue to ice and elevate as needed.  Continue to gradually increase activity as tolerated.  We have also discussed that if she wishes over the next few weeks or so she may return for knee aspiration and cortisone injection.  Otherwise, follow-up with [place] as needed. ? ?Follow-Up Instructions: Return if symptoms worsen or fail to improve.  ? ?Orders:  ?No orders of the defined types were placed in this encounter. ? ?No orders of the defined types were placed in this encounter. ? ? ?Imaging: ?No new imaging ? ?PMFS History: ?Patient Active Problem List  ? Diagnosis Date Noted  ? Acute lateral meniscus tear of right knee 02/13/2022  ? ?Past Medical History:  ?Diagnosis Date  ? Acute lateral meniscus tear of  right knee   ? Diabetes mellitus without complication ([place])   ? Hyperlipidemia   ?  ?No family history on file.  ?Past Surgical History:  ?Procedure Laterality Date  ? KNEE ARTHROSCOPY WITH MEDIAL MENISECTOMY Right 03/14/2022  ? Procedure: right knee arthroscopy partial lateral meniscectomy;  Surgeon: Leandrew Koyanagi, MD;  Location: [place];  Service: Orthopedics;  Laterality: Right;  ? ?Social History  ? ?Occupational History  ? Not on file  ?Tobacco Use  ? Smoking status: Never  ? Smokeless tobacco: Never  ?Substance and Sexual Activity  ? Alcohol use: No  ? Drug use: Never  ? Sexual activity: Not Currently  ?  Birth control/protection: Post-menopausal  ? ? ? ?

## 2022-06-30 ENCOUNTER — Ambulatory Visit (INDEPENDENT_AMBULATORY_CARE_PROVIDER_SITE_OTHER): Payer: Self-pay

## 2022-06-30 ENCOUNTER — Ambulatory Visit: Admission: EM | Admit: 2022-06-30 | Discharge: 2022-06-30 | Discharge status: Against Medical Advice | Payer: Self-pay

## 2022-06-30 ENCOUNTER — Ambulatory Visit (HOSPITAL_COMMUNITY)
Admission: EM | Admit: 2022-06-30 | Discharge: 2022-06-30 | Disposition: A | Discharge status: Home / Self Care | Payer: Self-pay | Attending: Internal Medicine | Admitting: Internal Medicine

## 2022-06-30 ENCOUNTER — Encounter (HOSPITAL_COMMUNITY): Payer: Self-pay | Admitting: Emergency Medicine

## 2022-06-30 DIAGNOSIS — M25571 Pain in right ankle and joints of right foot: Secondary | ICD-10-CM

## 2022-06-30 DIAGNOSIS — S82839A Other fracture of upper and lower end of unspecified fibula, initial encounter for closed fracture: Secondary | ICD-10-CM

## 2022-06-30 DIAGNOSIS — W19XXXA Unspecified fall, initial encounter: Secondary | ICD-10-CM

## 2022-06-30 DIAGNOSIS — S82831A Other fracture of upper and lower end of right fibula, initial encounter for closed fracture: Secondary | ICD-10-CM

## 2022-06-30 NOTE — ED Triage Notes (Signed)
Pt fell and twisted right ankle and having swelling and pain up to right knee. This happened 3-4 days ago. Pt reports pain to walk

## 2022-06-30 NOTE — ED Provider Notes (Signed)
[facility]    CSN: [id number] Arrival date & time: 06/30/22  1356      History   Chief Complaint No chief complaint on file.   HPI Wanda Bailey is a 61 y.o. female.   Patient presents with right lower extremity pain after fall that occurred about 3 to 4 days ago.  Patient reports that she is not sure what happened but states that she twisted her ankle over and fell.  Denies hitting head or losing consciousness.  She is having pain in the right lower leg directly below knee as well as in the right ankle and right dorsal surface of foot.  She has not taken any medications for pain.  She is able to bear weight and denies any numbness or tingling.     Past Medical History:  Diagnosis Date   Acute lateral meniscus tear of right knee    Diabetes mellitus without complication [facility])    Hyperlipidemia     Patient Active Problem List   Diagnosis Date Noted   Acute lateral meniscus tear of right knee 02/13/2022    Past Surgical History:  Procedure Laterality Date   KNEE ARTHROSCOPY WITH MEDIAL MENISECTOMY Right 03/14/2022   Procedure: right knee arthroscopy partial lateral meniscectomy;  Surgeon: Tarry Kos, MD;  Location: [facility];  Service: Orthopedics;  Laterality: Right;    OB History   No obstetric history on file.      Home Medications    Prior to Admission medications   Medication Sig Start Date End Date Taking? Authorizing Provider  atorvastatin (LIPITOR) 40 MG tablet Take 1 tablet (40 mg total) by mouth daily. 04/03/22   Georganna Skeans, MD  calcium carbonate (OS-CAL - DOSED IN MG OF ELEMENTAL CALCIUM) 1250 (500 Ca) MG tablet Take 1 tablet by mouth.    [provider]  cholecalciferol (VITAMIN D3) 25 MCG (1000 UNIT) tablet Take 1,000 Units by mouth daily.    [provider]  HYDROcodone-acetaminophen (NORCO) 5-325 MG tablet Take 1 tablet by mouth every 6 (six) hours as needed. 03/14/22   Tarry Kos, MD   metFORMIN (GLUCOPHAGE) 500 MG tablet Take 1 tablet (500 mg total) by mouth 2 (two) times daily with a meal. 11/13/21   Anders Simmonds, PA-C    Family History No family history on file.  Social History Social History   Tobacco Use   Smoking status: Never   Smokeless tobacco: Never  Substance Use Topics   Alcohol use: No   Drug use: Never     Allergies   Patient has no known allergies.   Review of Systems Review of Systems Per HPI  Physical Exam Triage Vital Signs ED Triage Vitals  Enc Vitals Group     BP 06/30/22 1431 (!) 137/56     Pulse Rate 06/30/22 1431 75     Resp 06/30/22 1431 17     Temp 06/30/22 1434 98.3 F (36.8 C)     Temp Source 06/30/22 1431 Oral     SpO2 06/30/22 1431 98 %     Weight --      Height --      Head Circumference --      Peak Flow --      Pain Score 06/30/22 1433 6     Pain Loc --      Pain Edu? --      Excl. in GC? --    No data found.  Updated Vital Signs  BP (!) 137/56 (BP Location: Right Arm)   Pulse 75   Temp 98.3 F (36.8 C) (Oral)   Resp 17   SpO2 98%   Visual Acuity Right Eye Distance:   Left Eye Distance:   Bilateral Distance:    Right Eye Near:   Left Eye Near:    Bilateral Near:     Physical Exam Constitutional:      General: She is not in acute distress.    Appearance: Normal appearance. She is not toxic-appearing or diaphoretic.  HENT:     Head: Normocephalic and atraumatic.  Eyes:     Extraocular Movements: Extraocular movements intact.     Conjunctiva/sclera: Conjunctivae normal.  Pulmonary:     Effort: Pulmonary effort is normal.  Musculoskeletal:       Legs:     Comments: Tenderness to palpation to anterior leg directly below knee, generalized throughout right ankle, and dorsal surface of right foot overlying second through fourth metatarsals at midfoot.  Mild swelling noted to ankle and associated tenderness to foot.  Patient can wiggle toes.  Has full range of motion of leg, ankle, foot.   Neurovascular intact.  No abrasions or lacerations noted.  Neurological:     General: No focal deficit present.     Mental Status: She is alert and oriented to person, place, and time. Mental status is at baseline.  Psychiatric:        Mood and Affect: Mood normal.        Behavior: Behavior normal.        Thought Content: Thought content normal.        Judgment: Judgment normal.      UC Treatments / Results  Labs (all labs ordered are listed, but only abnormal results are displayed) Labs Reviewed - No data to display  EKG   Radiology DG Foot Complete Right  Result Date: 06/30/2022 CLINICAL DATA:  Pain after twisting ankle 3 or 4 days ago. EXAM: RIGHT FOOT COMPLETE - 3+ VIEW COMPARISON:  None Available. FINDINGS: There is a soft tissue calcification adjacent to the distal tip of the fibula with adjacent fibular irregularity. The findings are consistent with an avulsion injury. I suspect an acute avulsion injury given the overlying soft tissue swelling and appearance. No other acute abnormalities are identified. IMPRESSION: Avulsion injury off the distal tip of the fibula, likely acute, with overlying soft tissue swelling. Electronically Signed   By: Gerome Sam III M.D.   On: 06/30/2022 15:30   DG Tibia/Fibula Right  Result Date: 06/30/2022 CLINICAL DATA:  Patient fell and twisted ankle 3 or 4 days ago. Pain and swelling. EXAM: RIGHT TIBIA AND FIBULA - 2 VIEW COMPARISON:  None Available. FINDINGS: Lateral soft tissue swelling in the ankle. No fracture or dislocation. IMPRESSION: Lateral soft tissue swelling in the ankle. No fracture or dislocation. Electronically Signed   By: Gerome Sam III M.D.   On: 06/30/2022 15:28    Procedures Procedures (including critical care time)  Medications Ordered in UC Medications - No data to display  Initial Impression / Assessment and Plan / UC Course  I have reviewed the triage vital signs and the nursing notes.  Pertinent labs &  imaging results that were available during my care of the patient were reviewed by me and considered in my medical decision making (see chart for details).     X-ray showing distal fibula avulsion fracture.  Cam boot applied and crutches were supplied for patient.  Advised patient nonweightbearing  until otherwise advised by orthopedist.  Also advised supportive care, elevation of extremity, ice application.  Patient was offered pain medication but declined.  Patient advised to take ibuprofen as needed given that recent kidney function is normal and there does not appear to any other any other contraindication to NSAIDs.  Patient provided with contact information for orthopedist and advised to follow-up on Monday for further evaluation and management.  Discussed return precautions.  Patient verbalized understanding and was agreeable with plan.  Interpreter used throughout patient interaction. Final Clinical Impressions(s) / UC Diagnoses   Final diagnoses:  Avulsion fracture of distal end of fibula  Pain in joint involving right ankle and foot  Fall, initial encounter     Discharge Instructions      You have a fracture of your ankle.  You have been supplied with a boot as well as crutches.  Please do not bear any weight until otherwise advised by orthopedist.  Also recommend elevation and ice application.  May take ibuprofen as needed for pain since you have declined other pain medications.     ED Prescriptions   None    PDMP not reviewed this encounter.   Gustavus Bryant, [place] 06/30/22 [phone]

## 2022-06-30 NOTE — Discharge Instructions (Addendum)
You have a fracture of your ankle.  You have been supplied with a boot as well as crutches.  Please do not bear any weight until otherwise advised by orthopedist.  Also recommend elevation and ice application.  May take ibuprofen as needed for pain since you have declined other pain medications.

## 2022-07-10 ENCOUNTER — Ambulatory Visit (INDEPENDENT_AMBULATORY_CARE_PROVIDER_SITE_OTHER): Payer: Self-pay | Admitting: Orthopaedic Surgery

## 2022-07-10 ENCOUNTER — Encounter: Payer: Self-pay | Admitting: Orthopaedic Surgery

## 2022-07-10 DIAGNOSIS — M25571 Pain in right ankle and joints of right foot: Secondary | ICD-10-CM

## 2022-07-10 MED ORDER — TRAMADOL HCL 50 MG PO TABS: 50.0000 mg | ORAL_TABLET | Freq: Two times a day (BID) | ORAL | 2 refills | Status: AC | PRN

## 2022-07-10 NOTE — Progress Notes (Signed)
Office Visit Note   Patient: Wanda Bailey           Date of Birth: 02-02-1961           MRN: [id number] Visit Date: 07/10/2022              Requested by: Georganna Skeans, MD [address] [place],  [place] [address] PCP: Georganna Skeans, MD   Assessment & Plan: Visit Diagnoses:  1. Pain in right ankle and joints of right foot     Plan: Impression is right ankle avulsion fracture to the distal fibula and probable ankle sprain.  This should be amenable to nonoperative treatment.  At this point, she will continue to weight-bear as tolerated with the cam walker.  Ice and elevate for pain and swelling.  Follow-up in 2 weeks for repeat evaluation and three-view x-rays of the right ankle.  Call with concerns or questions.  Follow-Up Instructions: Return in about 2 weeks (around 07/24/2022).   Orders:  No orders of the defined types were placed in this encounter.  Meds ordered this encounter  Medications   traMADol (ULTRAM) 50 MG tablet    Sig: Take 1 tablet (50 mg total) by mouth 2 (two) times daily as needed.    Dispense:  30 tablet    Refill:  2      Procedures: No procedures performed   Clinical Data: No additional findings.   Subjective: Chief Complaint  Patient presents with   Right Ankle - Injury    HPI patient is a pleasant 61 year old Spanish-speaking female who is here today with an interpreter.  She is here with following an injury to her right ankle which occurred on 06/30/2022.  She was walking when she fell inverting her ankle.  She was seen in the ED where x-rays were obtained.  X-rays demonstrated an avulsion fracture to the distal fibula.  She was placed in a cam walker weightbearing as tolerated.  She is here today for follow-up.  She notes mild pain worse with bearing weight.  All of her pain is to the lateral aspect.  She also notes paresthesias to the right ankle.  Review of Systems as detailed in HPI.  All other reviewed and are  negative.   Objective: Vital Signs: There were no vitals taken for this visit.  Physical Exam well-developed well-nourished female no acute distress.  Alert and oriented x3.  Ortho Exam right ankle exam shows mild swelling.  No ecchymosis.  Increased pain with dorsiflexion, plantarflexion, inversion and eversion.  Moderate tenderness to the distal fibula.  She is neurovascular tact distally.  Specialty Comments:  No specialty comments available.  Imaging: No new imaging   PMFS History: Patient Active Problem List   Diagnosis Date Noted   Acute lateral meniscus tear of right knee 02/13/2022   Past Medical History:  Diagnosis Date   Acute lateral meniscus tear of right knee    Diabetes mellitus without complication (HCC)    Hyperlipidemia     No family history on file.  Past Surgical History:  Procedure Laterality Date   KNEE ARTHROSCOPY WITH MEDIAL MENISECTOMY Right 03/14/2022   Procedure: right knee arthroscopy partial lateral meniscectomy;  Surgeon: Tarry Kos, MD;  Location: [facility];  Service: Orthopedics;  Laterality: Right;   Social History   Occupational History   Not on file  Tobacco Use   Smoking status: Never   Smokeless tobacco: Never  Substance and Sexual Activity   Alcohol  use: No   Drug use: Never   Sexual activity: Not Currently    Birth control/protection: Post-menopausal

## 2022-07-20 ENCOUNTER — Ambulatory Visit: Payer: Self-pay | Admitting: Family Medicine

## 2022-07-24 ENCOUNTER — Ambulatory Visit: Payer: Self-pay | Admitting: Orthopaedic Surgery

## 2023-03-26 ENCOUNTER — Ambulatory Visit (INDEPENDENT_AMBULATORY_CARE_PROVIDER_SITE_OTHER): Payer: Self-pay | Admitting: Orthopaedic Surgery

## 2023-03-26 DIAGNOSIS — M1711 Unilateral primary osteoarthritis, right knee: Secondary | ICD-10-CM

## 2023-03-26 MED ORDER — MELOXICAM 7.5 MG PO TABS: 7.5000 mg | ORAL_TABLET | Freq: Two times a day (BID) | ORAL | 2 refills | Status: AC | PRN

## 2023-03-26 MED ORDER — METHYLPREDNISOLONE ACETATE 40 MG/ML IJ SUSP
40.0000 mg | INTRAMUSCULAR | Status: AC | PRN
  Administered 2023-03-26: 40 mg via INTRA_ARTICULAR

## 2023-03-26 MED ORDER — LIDOCAINE HCL 1 % IJ SOLN
2.0000 mL | INTRAMUSCULAR | Status: AC | PRN
  Administered 2023-03-26: 2 mL

## 2023-03-26 MED ORDER — BUPIVACAINE HCL 0.5 % IJ SOLN
2.0000 mL | INTRAMUSCULAR | Status: AC | PRN
  Administered 2023-03-26: 2 mL via INTRA_ARTICULAR

## 2023-03-26 NOTE — Progress Notes (Signed)
Office Visit Note   Patient: Wanda Bailey           Date of Birth: 21-Jan-1961           MRN: [id number] Visit Date: 03/26/2023              Requested by: Georganna Skeans, MD [address] [place],  [place] [address] PCP: Georganna Skeans, MD   Assessment & Plan: Visit Diagnoses:  1. 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  Disease process explained.  Also talked about the importance of weight loss.  She would like to try Voltaren gel and cortisone injection today.  Language barrier increased the complexity of the encounter.  Follow-Up Instructions: No follow-ups on file.   Orders:  Orders Placed This Encounter  Procedures   Large Joint Inj   Meds ordered this encounter  Medications   meloxicam (MOBIC) 7.5 MG tablet    Sig: Take 1 tablet (7.5 mg total) by mouth 2 (two) times daily as needed for pain.    Dispense:  30 tablet    Refill:  2      Procedures: Large Joint Inj: R knee on 03/26/2023 2:55 PM Indications: pain Details: 22 G needle  Arthrogram: No  Medications: 40 mg methylPREDNISolone acetate 40 MG/ML; 2 mL lidocaine 1 %; 2 mL bupivacaine 0.5 % Consent was given by the patient. Patient was prepped and draped in the usual sterile fashion.       Clinical Data: No additional findings.   Subjective: Chief Complaint  Patient presents with   Right Knee - Pain    HPI  Patient returns today with interpreter for chronic right knee pain on the lateral side.  Denies any recent injuries.  Pain is worse with activity.  Review of Systems  Constitutional: Negative.   HENT: Negative.    Eyes: Negative.   Respiratory: Negative.    Cardiovascular: Negative.   Endocrine: Negative.   Musculoskeletal: Negative.   Neurological: Negative.   Hematological: Negative.   Psychiatric/Behavioral: Negative.    All other  systems reviewed and are negative.    Objective: Vital Signs: There were no vitals taken for this visit.  Physical Exam Vitals and nursing note reviewed.  Constitutional:      Appearance: She is well-developed.  HENT:     Head: Atraumatic.     Nose: Nose normal.  Eyes:     Extraocular Movements: Extraocular movements intact.  Cardiovascular:     Pulses: Normal pulses.  Pulmonary:     Effort: Pulmonary effort is normal.  Abdominal:     Palpations: Abdomen is soft.  Musculoskeletal:     Cervical back: Neck supple.  Skin:    General: Skin is warm.     Capillary Refill: Capillary refill takes less than 2 seconds.  Neurological:     Mental Status: She is alert. Mental status is at baseline.  Psychiatric:        Behavior: Behavior normal.        Thought Content: Thought content normal.        Judgment: Judgment normal.     Ortho Exam  Examination right knee shows mild valgus alignment.  No joint effusion.  Lateral joint line tenderness.  Collaterals and cruciates are stable.  Specialty Comments:  No specialty comments available.  Imaging: No results found.   PMFS History: Patient Active Problem  List   Diagnosis Date Noted   Acute lateral meniscus tear of right knee 02/13/2022   Past Medical History:  Diagnosis Date   Acute lateral meniscus tear of right knee    Diabetes mellitus without complication (HCC)    Hyperlipidemia     No family history on file.  Past Surgical History:  Procedure Laterality Date   KNEE ARTHROSCOPY WITH MEDIAL MENISECTOMY Right 03/14/2022   Procedure: right knee arthroscopy partial lateral meniscectomy;  Surgeon: Tarry Kos, MD;  Location: [facility];  Service: Orthopedics;  Laterality: Right;   Social History   Occupational History   Not on file  Tobacco Use   Smoking status: Never   Smokeless tobacco: Never  Substance and Sexual Activity   Alcohol use: No   Drug use: Never   Sexual activity: Not Currently     Birth control/protection: Post-menopausal

## 2023-04-07 IMAGING — DX DG KNEE AP/LAT W/ SUNRISE*R*
3 series · 3 of 3 positions shown · non-contrast
Comparison: None.

CLINICAL DATA: Right knee pain with swelling for 2 weeks. No
specific injury

EXAM:
RIGHT KNEE 3 VIEWS

[knee ap]
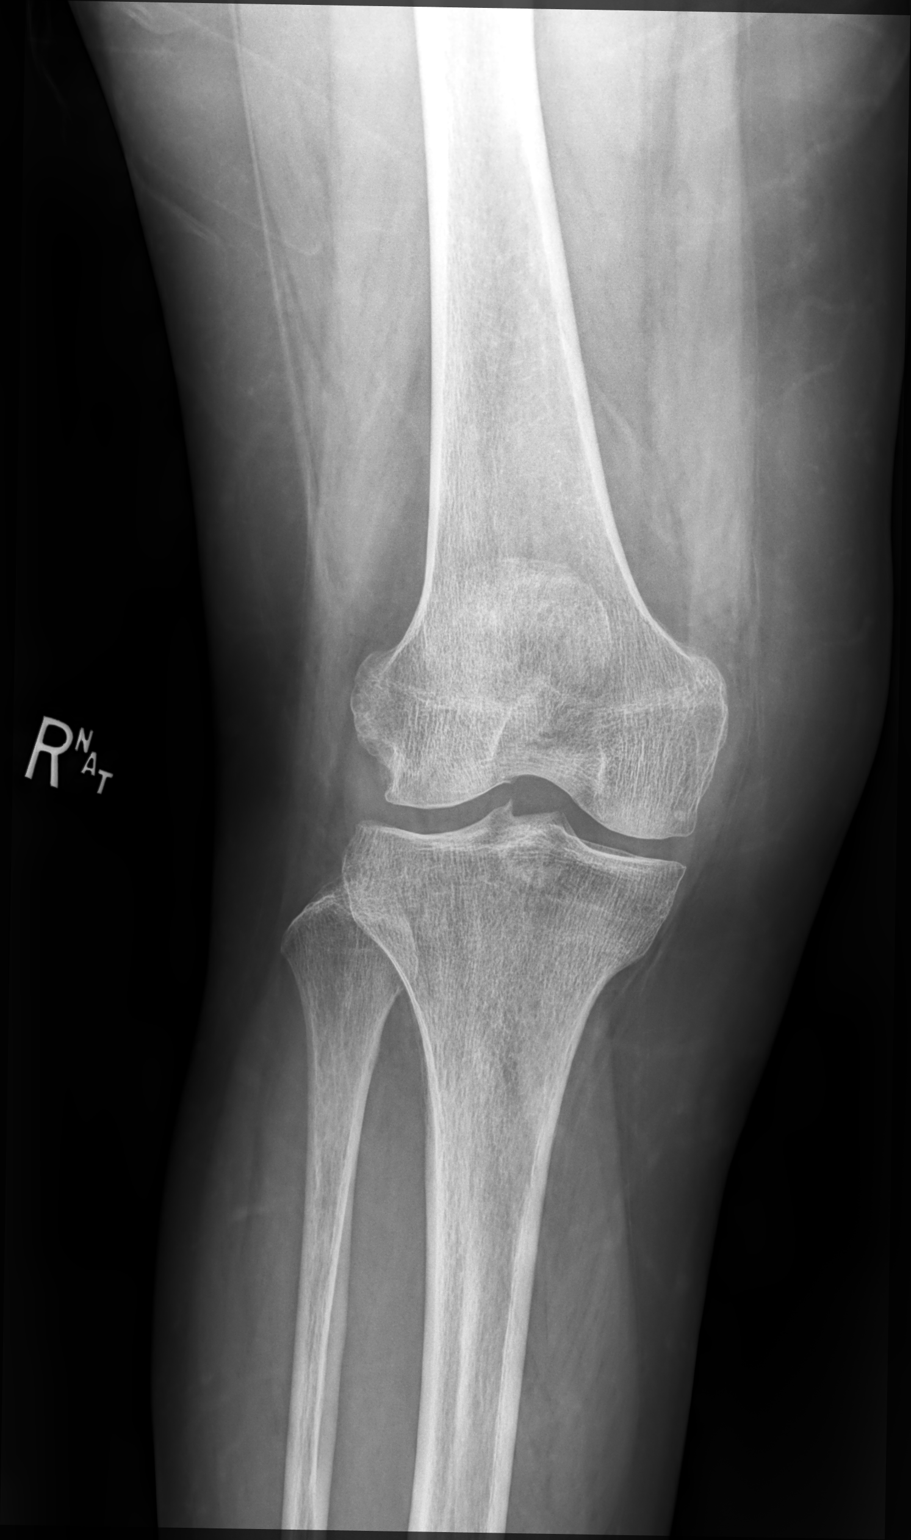

[patella tangential]
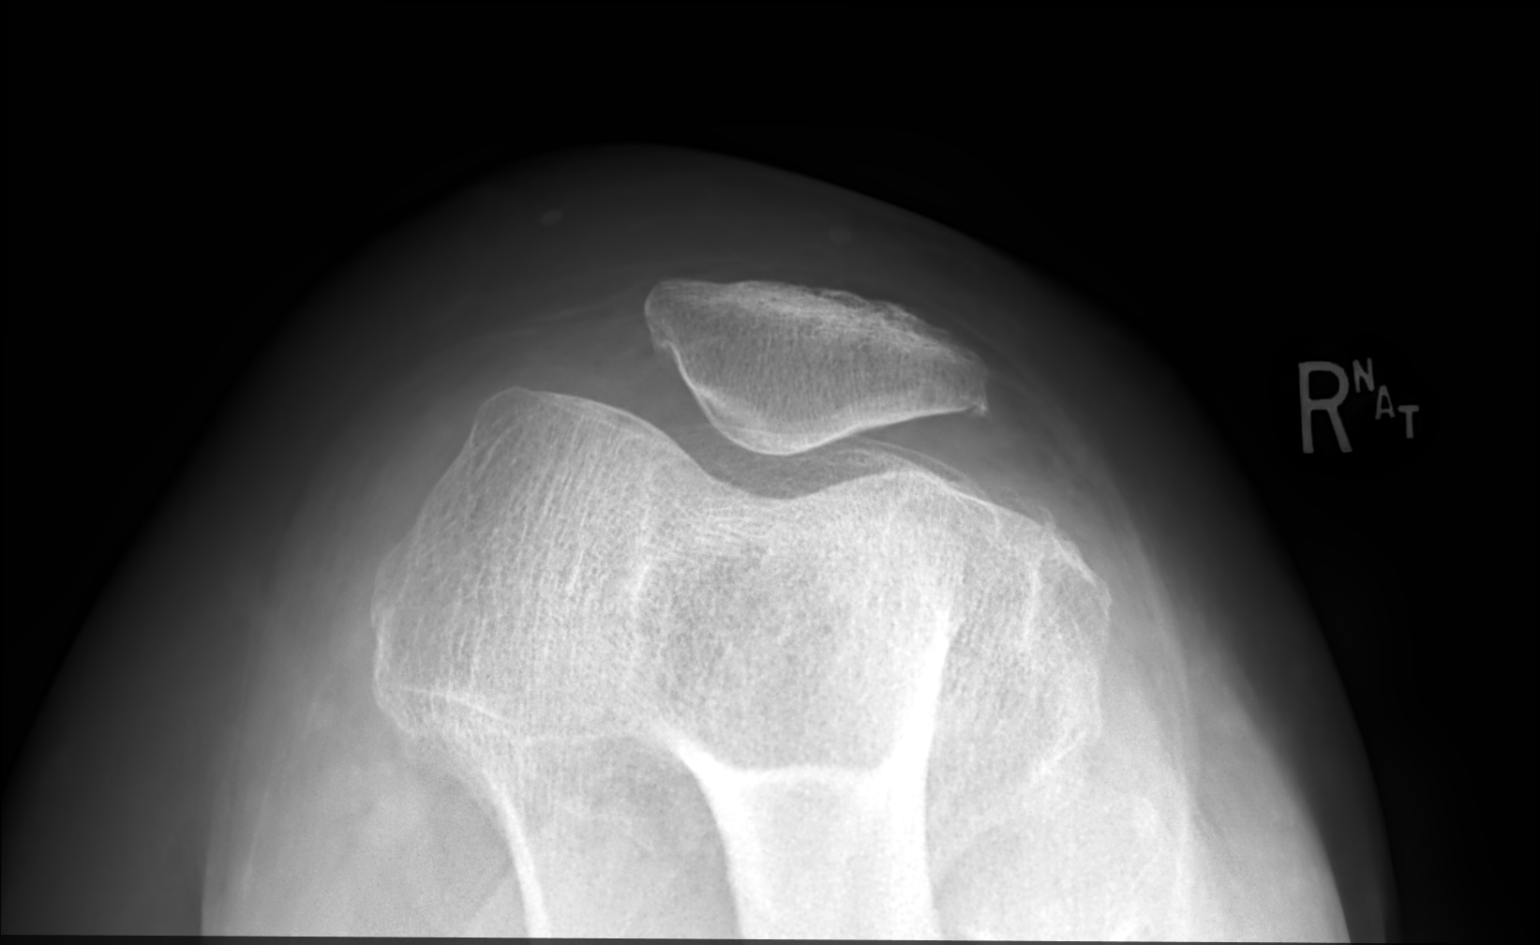

[knee lat]
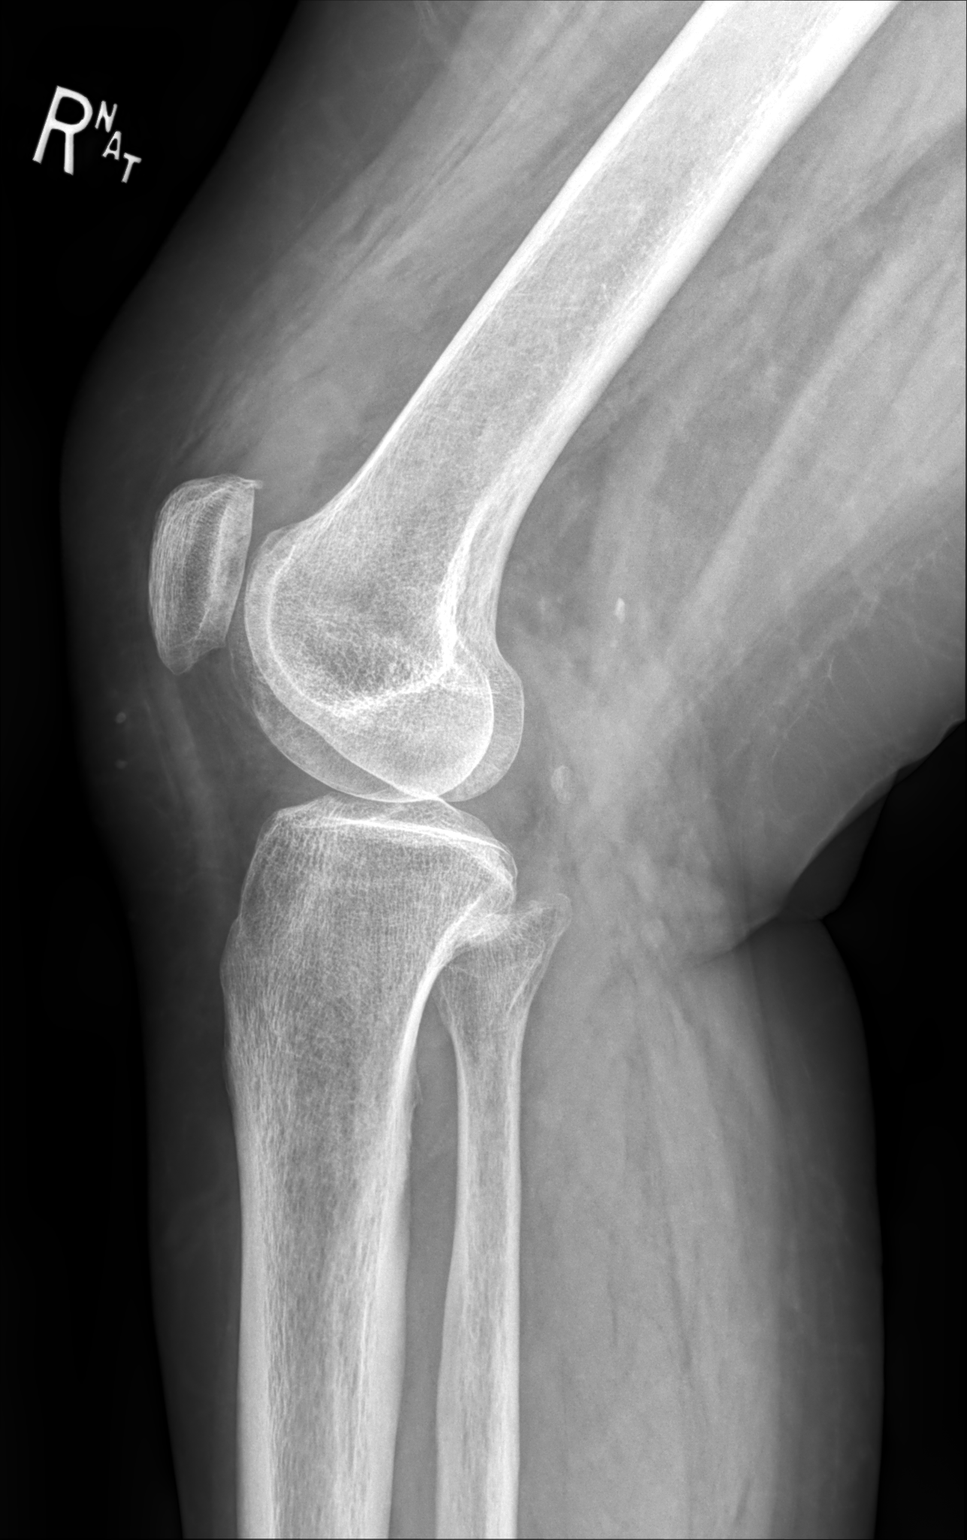

[3 of 3 positions shown; findings below may reference images not displayed]

FINDINGS: Knee joint effusion. No fracture or subluxation. Mild degenerative
marginal spurring seen at the tibial spines and patella. Subjective
osteopenia.
IMPRESSION: Joint effusion without acute osseous finding or joint space
narrowing.

## 2023-09-05 ENCOUNTER — Ambulatory Visit: Payer: Self-pay | Admitting: Physician Assistant

## 2023-09-13 ENCOUNTER — Ambulatory Visit
Admission: RE | Admit: 2023-09-13 | Discharge: 2023-09-13 | Disposition: A | Discharge status: Home / Self Care | Payer: Self-pay | Source: Ambulatory Visit | Attending: Physician Assistant | Admitting: Physician Assistant

## 2023-09-13 ENCOUNTER — Ambulatory Visit: Payer: Self-pay

## 2023-09-13 VITALS — BP 119/75 | HR 70 | Temp 98.0°F | Resp 20 | Ht 62.0 in | Wt 207.7 lb

## 2023-09-13 DIAGNOSIS — N760 Acute vaginitis: Secondary | ICD-10-CM | POA: Insufficient documentation

## 2023-09-13 LAB — POCT URINALYSIS DIP (MANUAL ENTRY)
Bilirubin, UA: NEGATIVE
Blood, UA: NEGATIVE
Glucose, UA: NEGATIVE mg/dL
Ketones, POC UA: NEGATIVE mg/dL
Leukocytes, UA: NEGATIVE
Nitrite, UA: NEGATIVE
Protein Ur, POC: NEGATIVE mg/dL
Spec Grav, UA: >=1.03 — AB (ref 1.010–1.025)
Urobilinogen, UA: 0.2 U/dL
pH, UA: 6 (ref 5.0–8.0)

## 2023-09-13 LAB — POCT FASTING CBG KUC MANUAL ENTRY: POCT Glucose (KUC): 136 mg/dL — AB (ref 70–99)

## 2023-09-13 MED ORDER — FLUCONAZOLE 150 MG PO TABS: ORAL_TABLET | ORAL | 0 refills | Status: AC

## 2023-09-13 MED ORDER — NYSTATIN 100000 UNIT/GM EX CREA: TOPICAL_CREAM | CUTANEOUS | 0 refills | Status: AC

## 2023-09-13 NOTE — Telephone Encounter (Signed)
Chief Complaint: Urinary symptoms Symptoms: Burning, vaginal itching, urine foul odor  Frequency: Constant onset 1 month ago  Pertinent Negatives: Patient denies pain  Disposition: [] ED /[x] Urgent Care (no appt availability in office) / [] Appointment(In office/virtual)/ []  [facility]/ [] Home Care/ [] Refused Recommended Disposition /[] [facility]/ []  Follow-up with PCP Additional Notes: Patient states she has had burning with urination for about 1 month and vaginal itching. Patient also states the urine has a foul odor. Patient requesting medication for treatment. Advised patient that she will need an appointment. No appointments available in office. Patient has been scheduled at Urgent care today at 1845. Advised to callback if symptoms get worse.  Using [employer] [phone].  Summary: Question UTI   Burning, vaginal pain, odor, (1 month)     Reason for Disposition  Bad or foul-smelling urine  Answer Assessment - Initial Assessment Questions 1. SYMPTOM: "What's the main symptom you're concerned about?" (e.g., frequency, incontinence)     Burning with urination 2. ONSET: "When did the  Burning  start?"     About 1 month ago 3. PAIN: "Is there any pain?" If Yes, ask: "How bad is it?" (Scale: 1-10; mild, moderate, severe)     Burning 8/10 4. CAUSE: "What do you think is causing the symptoms?"     An infection 5. OTHER SYMPTOMS: "Do you have any other symptoms?" (e.g., blood in urine, fever, flank pain, pain with urination)     Itching, burning and foul odor  Protocols used: Urinary Symptoms-A-AH

## 2023-09-13 NOTE — ED Triage Notes (Signed)
Due to language barrier, an interpreter was present during the history-taking and subsequent discussion (and for part of the physical exam) with this patient. Arlen. Number: [id number].   "I have this discomfort feeling in my vagina and when I pee it stings and burns". This started "about a month ago, it comes and goes but getting worse". "I am also peeing a lot and sometimes I can't make it to the bathroom". No abd pain. Having "a lot of itching in vaginal area and between legs". No fever. No vaginal discharge. No concern for STI.

## 2023-09-13 NOTE — ED Provider Notes (Signed)
[facility]    CSN: [id number] Arrival date & time: 09/13/23  1803      History   Chief Complaint Chief Complaint  Patient presents with   Vaginal Itching    Entered by patient    HPI Wanda Bailey is a 62 y.o. female.   Patient here today for evaluation of vaginal discomfort that occurs when she urinates.  She reports a stinging and burning pain.  She notes that symptoms started about a month ago but seems to be getting worse.  She also feels that she is urinating more frequently.  She denies any abdominal pain.  She also notes itching in her vaginal area between her legs.  She has not had any discharge and has no concerns for STDs.  She denies any fever.  The history is provided by the patient.  Vaginal Itching Pertinent negatives include no abdominal pain and no shortness of breath.    Past Medical History:  Diagnosis Date   Acute lateral meniscus tear of right knee    Diabetes mellitus without complication [facility])    Hyperlipidemia     Patient Active Problem List   Diagnosis Date Noted   Acute lateral meniscus tear of right knee 02/13/2022    Past Surgical History:  Procedure Laterality Date   KNEE ARTHROSCOPY WITH MEDIAL MENISECTOMY Right 03/14/2022   Procedure: right knee arthroscopy partial lateral meniscectomy;  Surgeon: Tarry Kos, MD;  Location: [facility];  Service: Orthopedics;  Laterality: Right;    OB History   No obstetric history on file.      Home Medications    Prior to Admission medications   Medication Sig Start Date End Date Taking? Authorizing Provider  atorvastatin (LIPITOR) 40 MG tablet Take 1 tablet (40 mg total) by mouth daily. Patient taking differently: Take 80 mg by mouth daily. 04/03/22  Yes Georganna Skeans, MD  fluconazole (DIFLUCAN) 150 MG tablet Take one tab PO today and repeat dose in 3 days if symptoms persist 09/13/23  Yes Tomi Bamberger, PA-C  metFORMIN (GLUCOPHAGE) 500 MG tablet Take  1 tablet (500 mg total) by mouth 2 (two) times daily with a meal. 11/13/21  Yes McClung, Angela M, PA-C  nystatin cream (MYCOSTATIN) Apply to affected area 2 times daily 09/13/23  Yes Tomi Bamberger, PA-C  calcium carbonate (OS-CAL - DOSED IN MG OF ELEMENTAL CALCIUM) 1250 (500 Ca) MG tablet Take 1 tablet by mouth.    [provider]  cholecalciferol (VITAMIN D3) 25 MCG (1000 UNIT) tablet Take 1,000 Units by mouth daily.    [provider]  HYDROcodone-acetaminophen (NORCO) 5-325 MG tablet Take 1 tablet by mouth every 6 (six) hours as needed. 03/14/22   Tarry Kos, MD  meloxicam (MOBIC) 7.5 MG tablet Take 1 tablet (7.5 mg total) by mouth 2 (two) times daily as needed for pain. 03/26/23   Tarry Kos, MD  traMADol (ULTRAM) 50 MG tablet Take 1 tablet (50 mg total) by mouth 2 (two) times daily as needed. 07/10/22   Cristie Hem, PA-C    Family History History reviewed. No pertinent family history.  Social History Social History   Tobacco Use   Smoking status: Never   Smokeless tobacco: Never  Vaping Use   Vaping status: Never Used  Substance Use Topics   Alcohol use: No   Drug use: Never     Allergies   Patient has no known allergies.   Review of Systems Review of  Systems  Constitutional:  Negative for chills and fever.  Eyes:  Negative for discharge and redness.  Respiratory:  Negative for shortness of breath.   Gastrointestinal:  Negative for abdominal pain, nausea and vomiting.  Genitourinary:  Positive for frequency. Negative for dysuria and vaginal discharge.     Physical Exam Triage Vital Signs ED Triage Vitals  Encounter Vitals Group     BP 09/13/23 1820 119/75     Systolic BP Percentile --      Diastolic BP Percentile --      Pulse Rate 09/13/23 1820 70     Resp 09/13/23 1820 20     Temp 09/13/23 1820 98 F (36.7 C)     Temp Source 09/13/23 1820 Oral     SpO2 09/13/23 1820 97 %     Weight 09/13/23 1815 207 lb 10.8 oz (94.2 kg)      Height 09/13/23 1815 5\' 2"  (1.575 m)     Head Circumference --      Peak Flow --      Pain Score 09/13/23 1808 1     Pain Loc --      Pain Education --      Exclude from Growth Chart --    No data found.  Updated Vital Signs BP 119/75 (BP Location: Right Arm)   Pulse 70   Temp 98 F (36.7 C) (Oral)   Resp 20   Ht 5\' 2"  (1.575 m)   Wt 207 lb 10.8 oz (94.2 kg)   SpO2 97%   BMI 37.98 kg/m   Visual Acuity Right Eye Distance:   Left Eye Distance:   Bilateral Distance:    Right Eye Near:   Left Eye Near:    Bilateral Near:     Physical Exam Vitals and nursing note reviewed.  Constitutional:      General: She is not in acute distress.    Appearance: Normal appearance. She is not ill-appearing.  HENT:     Head: Normocephalic and atraumatic.  Eyes:     Conjunctiva/sclera: Conjunctivae normal.  Cardiovascular:     Rate and Rhythm: Normal rate.  Pulmonary:     Effort: Pulmonary effort is normal. No respiratory distress.  Neurological:     Mental Status: She is alert.  Psychiatric:        Mood and Affect: Mood normal.        Behavior: Behavior normal.        Thought Content: Thought content normal.      UC Treatments / Results  Labs (all labs ordered are listed, but only abnormal results are displayed) Labs Reviewed  POCT URINALYSIS DIP (MANUAL ENTRY) - Abnormal; Notable for the following components:      Result Value   Spec Grav, UA >=1.030 (*)    All other components within normal limits  POCT FASTING CBG [facility] MANUAL ENTRY - Abnormal; Notable for the following components:   POCT Glucose ([facility]) 136 (*)    All other components within normal limits  CERVICOVAGINAL ANCILLARY ONLY    EKG   Radiology No results found.  Procedures Procedures (including critical care time)  Medications Ordered in UC Medications - No data to display  Initial Impression / Assessment and Plan / UC Course  I have reviewed the triage vital signs and the nursing  notes.  Pertinent labs & imaging results that were available during my care of the patient were reviewed by me and considered in my medical decision making (see chart for  details).    Unclear etiology of symptoms however given diabetic status will treat with Diflucan as well as nystatin cream given irritation externally.  Encouraged follow-up if no gradual improvement with any further concerns.  Final Clinical Impressions(s) / UC Diagnoses   Final diagnoses:  Acute vaginitis   Discharge Instructions   None    ED Prescriptions     Medication Sig Dispense Auth. Provider   fluconazole (DIFLUCAN) 150 MG tablet Take one tab PO today and repeat dose in 3 days if symptoms persist 2 tablet Erma Pinto F, PA-C   nystatin cream (MYCOSTATIN) Apply to affected area 2 times daily 30 g Tomi Bamberger, PA-C      PDMP not reviewed this encounter.   Tomi Bamberger, PA-C 09/17/23 1153

## 2023-09-16 LAB — CERVICOVAGINAL ANCILLARY ONLY
Bacterial Vaginitis (gardnerella): NEGATIVE
Candida Glabrata: NEGATIVE
Candida Vaginitis: NEGATIVE
Chlamydia: NEGATIVE
Comment: NEGATIVE
Comment: NEGATIVE
Comment: NEGATIVE
Comment: NEGATIVE
Comment: NEGATIVE
Comment: NORMAL
Neisseria Gonorrhea: NEGATIVE
Trichomonas: NEGATIVE

## 2023-09-20 ENCOUNTER — Telehealth: Payer: Self-pay | Admitting: *Deleted

## 2023-09-20 ENCOUNTER — Other Ambulatory Visit: Payer: Self-pay | Admitting: Family Medicine

## 2023-09-20 DIAGNOSIS — Z1212 Encounter for screening for malignant neoplasm of rectum: Secondary | ICD-10-CM

## 2023-09-20 DIAGNOSIS — Z1211 Encounter for screening for malignant neoplasm of colon: Secondary | ICD-10-CM

## 2023-09-20 NOTE — Telephone Encounter (Signed)
Pt was scheduled for concerns for 10/24

## 2023-09-20 NOTE — Telephone Encounter (Signed)
Pt given lab results per interpreter Byrd Hesselbach ID # [id number] from UC visit  09/13/23 on 09/20/23. Pt verbalized understanding cervicovaginal and urine check negative. Patient requesting appt for right great toe pain . Reports pain approx  5 months , can walk , no redness or drainage from toe. Concerned may be ingrown toenail. Recommended if pain worsens or sx worse go to UC or ED for severe pain. Appt scheduled 10/10/23.

## 2023-09-23 ENCOUNTER — Telehealth: Payer: Self-pay | Admitting: Family Medicine

## 2023-09-23 NOTE — Telephone Encounter (Signed)
Patient dropped off document  Cologuard Order , to be filled out by provider. Patient requested to send it back via Fax within 5-days. Document is located in providers tray at front office.Fax number [phone].

## 2023-09-24 NOTE — Telephone Encounter (Signed)
Please advice  

## 2023-09-24 NOTE — Telephone Encounter (Signed)
Patient does not have any insurance at this time

## 2023-09-27 ENCOUNTER — Telehealth: Payer: Self-pay | Admitting: Family Medicine

## 2023-09-27 NOTE — Telephone Encounter (Signed)
Cologuard Incomplete Order , to be filled out by provider. Patient requested to send it back via Fax within 5-days. Document is located in providers tray at front office.Please fax at   [phone]

## 2023-10-01 NOTE — Telephone Encounter (Signed)
Form on provider desk for completion.

## 2023-10-04 NOTE — Telephone Encounter (Signed)
Patient has no insurance for [employer]

## 2023-10-10 ENCOUNTER — Encounter: Payer: Self-pay | Admitting: Family Medicine

## 2023-10-10 ENCOUNTER — Ambulatory Visit (INDEPENDENT_AMBULATORY_CARE_PROVIDER_SITE_OTHER): Payer: Self-pay | Admitting: Family Medicine

## 2023-10-10 VITALS — BP 117/76 | HR 93 | Temp 98.5°F | Resp 16 | Ht 62.0 in | Wt 210.6 lb

## 2023-10-10 DIAGNOSIS — M25561 Pain in right knee: Secondary | ICD-10-CM

## 2023-10-10 DIAGNOSIS — Z6838 Body mass index (BMI) 38.0-38.9, adult: Secondary | ICD-10-CM

## 2023-10-10 DIAGNOSIS — Z789 Other specified health status: Secondary | ICD-10-CM

## 2023-10-10 DIAGNOSIS — G8929 Other chronic pain: Secondary | ICD-10-CM

## 2023-10-10 DIAGNOSIS — M79674 Pain in right toe(s): Secondary | ICD-10-CM

## 2023-10-10 DIAGNOSIS — M25562 Pain in left knee: Secondary | ICD-10-CM

## 2023-10-10 DIAGNOSIS — E66812 Obesity, class 2: Secondary | ICD-10-CM

## 2023-10-10 DIAGNOSIS — E6609 Other obesity due to excess calories: Secondary | ICD-10-CM

## 2023-10-10 MED ORDER — TRIAMCINOLONE ACETONIDE 40 MG/ML IJ SUSP: 40.0000 mg | Freq: Once | INTRAMUSCULAR | Status: AC

## 2023-10-10 NOTE — Progress Notes (Signed)
Established Patient Office Visit  Subjective    Patient ID: Wanda Bailey, female    DOB: Mar 26, 1961  Age: 62 y.o. MRN: [id number]  CC:  Chief Complaint  Patient presents with   toenail pain    Right great toe    HPI Wanda Bailey presents for complaint of great toe pain. Patient denies known trauma or injury. Patient also reports continue chronic pain of both knees. This visit was aided by an interpreter.   Outpatient Encounter Medications as of 10/10/2023  Medication Sig   atorvastatin (LIPITOR) 40 MG tablet Take 1 tablet (40 mg total) by mouth daily. (Patient taking differently: Take 80 mg by mouth daily.)   calcium carbonate (OS-CAL - DOSED IN MG OF ELEMENTAL CALCIUM) 1250 (500 Ca) MG tablet Take 1 tablet by mouth.   cholecalciferol (VITAMIN D3) 25 MCG (1000 UNIT) tablet Take 1,000 Units by mouth daily.   fluconazole (DIFLUCAN) 150 MG tablet Take one tab PO today and repeat dose in 3 days if symptoms persist   HYDROcodone-acetaminophen (NORCO) 5-325 MG tablet Take 1 tablet by mouth every 6 (six) hours as needed.   meloxicam (MOBIC) 7.5 MG tablet Take 1 tablet (7.5 mg total) by mouth 2 (two) times daily as needed for pain.   metFORMIN (GLUCOPHAGE) 500 MG tablet Take 1 tablet (500 mg total) by mouth 2 (two) times daily with a meal.   nystatin cream (MYCOSTATIN) Apply to affected area 2 times daily   traMADol (ULTRAM) 50 MG tablet Take 1 tablet (50 mg total) by mouth 2 (two) times daily as needed.   Facility-Administered Encounter Medications as of 10/10/2023  Medication   triamcinolone acetonide (KENALOG-40) injection 40 mg    Past Medical History:  Diagnosis Date   Acute lateral meniscus tear of right knee    Diabetes mellitus without complication (HCC)    Hyperlipidemia     Past Surgical History:  Procedure Laterality Date   KNEE ARTHROSCOPY WITH MEDIAL MENISECTOMY Right 03/14/2022   Procedure: right knee arthroscopy partial lateral meniscectomy;   Surgeon: Tarry Kos, MD;  Location: [facility];  Service: Orthopedics;  Laterality: Right;    History reviewed. No pertinent family history.  Social History   Socioeconomic History   Marital status: Married    Spouse name: Not on file   Number of children: Not on file   Years of education: Not on file   Highest education level: Not on file  Occupational History   Not on file  Tobacco Use   Smoking status: Never   Smokeless tobacco: Never  Vaping Use   Vaping status: Never Used  Substance and Sexual Activity   Alcohol use: No   Drug use: Never   Sexual activity: Yes    Birth control/protection: Post-menopausal    Comment: Last Encounter: [id number]  Other Topics Concern   Not on file  Social History Narrative   ** Merged History Encounter **       Social Determinants of Health   Financial Resource Strain: Medium Risk (10/10/2023)   Overall Financial Resource Strain (CARDIA)    Difficulty of Paying Living Expenses: Somewhat hard  Food Insecurity: Food Insecurity Present (10/10/2023)   Hunger Vital Sign    Worried About Running Out of Food in the Last Year: Sometimes true    Ran Out of Food in the Last Year: Sometimes true  Transportation Needs: Unmet Transportation Needs (10/10/2023)   PRAPARE - Administrator, Civil Service (Medical): Yes  Lack of Transportation (Non-Medical): Yes  Physical Activity: Insufficiently Active (10/10/2023)   Exercise Vital Sign    Days of Exercise per Week: 3 days    Minutes of Exercise per Session: 30 min  Stress: No Stress Concern Present (10/10/2023)   [employer] of Occupational Health - Occupational Stress Questionnaire    Feeling of Stress : Not at all  Social Connections: Moderately Integrated (10/10/2023)   Social Connection and Isolation Panel [NHANES]    Frequency of Communication with Friends and Family: More than three times a week    Frequency of Social Gatherings with Friends and  Family: More than three times a week    Attends Religious Services: 1 to 4 times per year    Active Member of [employer] or Organizations: No    Attends Banker Meetings: Never    Marital Status: Married  Catering manager Violence: Not At Risk (10/10/2023)   Humiliation, Afraid, Rape, and Kick questionnaire    Fear of Current or Ex-Partner: No    Emotionally Abused: No    Physically Abused: No    Sexually Abused: No    Review of Systems  All other systems reviewed and are negative.       Objective    BP 117/76 (BP Location: Right Arm, Patient Position: Sitting, Cuff Size: Large)   Pulse 93   Temp 98.5 F (36.9 C) (Oral)   Resp 16   Ht 5\' 2"  (1.575 m)   Wt 210 lb 9.6 oz (95.5 kg)   SpO2 93%   BMI 38.52 kg/m   Physical Exam Vitals and nursing note reviewed.  Constitutional:      General: She is not in acute distress. Cardiovascular:     Rate and Rhythm: Normal rate and regular rhythm.  Pulmonary:     Effort: Pulmonary effort is normal.     Breath sounds: Normal breath sounds.  Abdominal:     Palpations: Abdomen is soft.     Tenderness: There is no abdominal tenderness.  Musculoskeletal:     Right knee: Decreased range of motion. Tenderness present.     Left knee: Decreased range of motion. Tenderness present.     Comments: Right great toe pain  Neurological:     General: No focal deficit present.     Mental Status: She is alert and oriented to person, place, and time.         Assessment & Plan:   Great toe pain, right -     Triamcinolone Acetonide  Chronic pain of both knees -     Triamcinolone Acetonide  Class 2 obesity due to excess calories without serious comorbidity with body mass index (BMI) of 38.0 to 38.9 in adult  Language barrier to communication     Return if symptoms worsen or fail to improve.   [name], MD

## 2023-10-15 ENCOUNTER — Encounter: Payer: Self-pay | Admitting: Family Medicine

## 2023-11-04 ENCOUNTER — Ambulatory Visit (INDEPENDENT_AMBULATORY_CARE_PROVIDER_SITE_OTHER): Payer: Self-pay | Admitting: Family Medicine

## 2023-11-04 ENCOUNTER — Encounter: Payer: Self-pay | Admitting: Family Medicine

## 2023-11-04 VITALS — BP 131/86 | HR 60 | Temp 98.6°F | Resp 18 | Ht 62.76 in | Wt 210.2 lb

## 2023-11-04 DIAGNOSIS — R7309 Other abnormal glucose: Secondary | ICD-10-CM

## 2023-11-04 DIAGNOSIS — R519 Headache, unspecified: Secondary | ICD-10-CM

## 2023-11-04 DIAGNOSIS — Z6837 Body mass index (BMI) 37.0-37.9, adult: Secondary | ICD-10-CM

## 2023-11-04 DIAGNOSIS — Z789 Other specified health status: Secondary | ICD-10-CM

## 2023-11-04 DIAGNOSIS — E7800 Pure hypercholesterolemia, unspecified: Secondary | ICD-10-CM

## 2023-11-04 DIAGNOSIS — E66812 Obesity, class 2: Secondary | ICD-10-CM

## 2023-11-05 LAB — HEMOGLOBIN A1C
Est. average glucose Bld gHb Est-mCnc: 148 mg/dL
Hgb A1c MFr Bld: 6.8 % — ABNORMAL HIGH (ref 4.8–5.6)

## 2023-11-05 LAB — LIPID PANEL
Chol/HDL Ratio: 4.8 ratio — ABNORMAL HIGH (ref 0.0–4.4)
Cholesterol, Total: 309 mg/dL — ABNORMAL HIGH (ref 100–199)
HDL: 64 mg/dL (ref >39)
LDL Chol Calc (NIH): 211 mg/dL — ABNORMAL HIGH (ref 0–99)
Triglycerides: 180 mg/dL — ABNORMAL HIGH (ref 0–149)
VLDL Cholesterol Cal: 34 mg/dL (ref 5–40)

## 2023-11-05 LAB — CMP14+EGFR
ALT: 24 [IU]/L (ref 0–32)
AST: 22 [IU]/L (ref 0–40)
Albumin: 4.5 g/dL (ref 3.9–4.9)
Alkaline Phosphatase: 99 [IU]/L (ref 44–121)
BUN/Creatinine Ratio: 22 (ref 12–28)
BUN: 11 mg/dL (ref 8–27)
Bilirubin Total: 0.5 mg/dL (ref 0.0–1.2)
CO2: 21 mmol/L (ref 20–29)
Calcium: 9.5 mg/dL (ref 8.7–10.3)
Chloride: 103 mmol/L (ref 96–106)
Creatinine, Ser: 0.5 mg/dL — ABNORMAL LOW (ref 0.57–1.00)
Globulin, Total: 2.5 g/dL (ref 1.5–4.5)
Glucose: 92 mg/dL (ref 70–99)
Potassium: 4.2 mmol/L (ref 3.5–5.2)
Sodium: 141 mmol/L (ref 134–144)
Total Protein: 7 g/dL (ref 6.0–8.5)
eGFR: 106 mL/min/{1.73_m2} (ref >59)

## 2023-11-06 ENCOUNTER — Encounter: Payer: Self-pay | Admitting: Family Medicine

## 2023-11-06 NOTE — Progress Notes (Signed)
Established Patient Office Visit  Subjective    Patient ID: Wanda Bailey, female    DOB: [birth year]  Age: 62 y.o. MRN: [id number]  CC:  Chief Complaint  Patient presents with   Follow-up    Cholesterol , not feeling well    HPI Wanda Bailey presents for follow up of diabetes and high cholesterol. She reports that she believes her blood sugars may be elevated as she has not been feeling well lately. This visit was aided by an interpreter.   Outpatient Encounter Medications as of 11/04/2023  Medication Sig   calcium carbonate (OS-CAL - DOSED IN MG OF ELEMENTAL CALCIUM) 1250 (500 Ca) MG tablet Take 1 tablet by mouth.   cholecalciferol (VITAMIN D3) 25 MCG (1000 UNIT) tablet Take 1,000 Units by mouth daily.   fluconazole (DIFLUCAN) 150 MG tablet Take one tab PO today and repeat dose in 3 days if symptoms persist   HYDROcodone-acetaminophen (NORCO) 5-325 MG tablet Take 1 tablet by mouth every 6 (six) hours as needed.   meloxicam (MOBIC) 7.5 MG tablet Take 1 tablet (7.5 mg total) by mouth 2 (two) times daily as needed for pain.   metFORMIN (GLUCOPHAGE) 500 MG tablet Take 1 tablet (500 mg total) by mouth 2 (two) times daily with a meal.   nystatin cream (MYCOSTATIN) Apply to affected area 2 times daily   traMADol (ULTRAM) 50 MG tablet Take 1 tablet (50 mg total) by mouth 2 (two) times daily as needed.   atorvastatin (LIPITOR) 40 MG tablet Take 1 tablet (40 mg total) by mouth daily. (Patient not taking: Reported on 11/04/2023)   Facility-Administered Encounter Medications as of 11/04/2023  Medication   triamcinolone acetonide (KENALOG-40) injection 40 mg    Past Medical History:  Diagnosis Date   Acute lateral meniscus tear of right knee    Diabetes mellitus without complication (HCC)    Hyperlipidemia     Past Surgical History:  Procedure Laterality Date   KNEE ARTHROSCOPY WITH MEDIAL MENISECTOMY Right 03/14/2022   Procedure: right knee arthroscopy partial  lateral meniscectomy;  Surgeon: Tarry Kos, MD;  Location: [facility];  Service: Orthopedics;  Laterality: Right;    History reviewed. No pertinent family history.  Social History   Socioeconomic History   Marital status: Married    Spouse name: Not on file   Number of children: Not on file   Years of education: Not on file   Highest education level: Not on file  Occupational History   Not on file  Tobacco Use   Smoking status: Never   Smokeless tobacco: Never  Vaping Use   Vaping status: Never Used  Substance and Sexual Activity   Alcohol use: No   Drug use: Never   Sexual activity: Yes    Birth control/protection: Post-menopausal    Comment: Last Encounter: [id number]  Other Topics Concern   Not on file  Social History Narrative   ** Merged History Encounter **       Social Determinants of Health   Financial Resource Strain: Medium Risk (10/10/2023)   Overall Financial Resource Strain (CARDIA)    Difficulty of Paying Living Expenses: Somewhat hard  Food Insecurity: Food Insecurity Present (10/10/2023)   Hunger Vital Sign    Worried About Running Out of Food in the Last Year: Sometimes true    Ran Out of Food in the Last Year: Sometimes true  Transportation Needs: Unmet Transportation Needs (10/10/2023)   PRAPARE - Transportation    Lack  of Transportation (Medical): Yes    Lack of Transportation (Non-Medical): Yes  Physical Activity: Insufficiently Active (10/10/2023)   Exercise Vital Sign    Days of Exercise per Week: 3 days    Minutes of Exercise per Session: 30 min  Stress: No Stress Concern Present (10/10/2023)   [employer] of Occupational Health - Occupational Stress Questionnaire    Feeling of Stress : Not at all  Social Connections: Moderately Integrated (10/10/2023)   Social Connection and Isolation Panel [NHANES]    Frequency of Communication with Friends and Family: More than three times a week    Frequency of Social Gatherings  with Friends and Family: More than three times a week    Attends Religious Services: 1 to 4 times per year    Active Member of [employer] or Organizations: No    Attends Banker Meetings: Never    Marital Status: Married  Catering manager Violence: Not At Risk (10/10/2023)   Humiliation, Afraid, Rape, and Kick questionnaire    Fear of Current or Ex-Partner: No    Emotionally Abused: No    Physically Abused: No    Sexually Abused: No    Review of Systems  All other systems reviewed and are negative.       Objective    BP 131/86 (BP Location: Right Arm, Patient Position: Sitting, Cuff Size: Large)   Pulse 60   Temp 98.6 F (37 C) (Oral)   Resp 18   Ht 5' 2.76" (1.594 m)   Wt 210 lb 3.2 oz (95.3 kg)   SpO2 95%   BMI 37.53 kg/m   Physical Exam Vitals and nursing note reviewed.  Constitutional:      General: She is not in acute distress. Cardiovascular:     Rate and Rhythm: Normal rate and regular rhythm.  Pulmonary:     Effort: Pulmonary effort is normal.     Breath sounds: Normal breath sounds.  Abdominal:     Palpations: Abdomen is soft.     Tenderness: There is no abdominal tenderness.  Neurological:     General: No focal deficit present.     Mental Status: She is alert and oriented to person, place, and time.         Assessment & Plan:   Elevated cholesterol -     Lipid panel  Elevated glucose -     CMP14+EGFR -     Hemoglobin A1c  Nonintractable headache, unspecified chronicity pattern, unspecified headache type  Class 2 severe obesity due to excess calories with serious comorbidity and body mass index (BMI) of 37.0 to 37.9 in adult [facility])  Language barrier to communication     Return in about 3 months (around 02/04/2024) for follow up.   Wanda Raymond, MD

## 2024-05-08 ENCOUNTER — Telehealth: Payer: Self-pay

## 2024-05-08 NOTE — Telephone Encounter (Signed)
 Routing to office

## 2024-05-08 NOTE — Telephone Encounter (Signed)
 Copied from CRM [id number]. Topic: Clinical - Request for Lab/Test Order >> May 08, 2024 11:23 AM Elle L wrote: Reason for CRM: The patient is requesting lab orders and a lab appointment before her physical on 7/3.

## 2024-05-13 NOTE — Telephone Encounter (Signed)
 Called patient and she stated she just wanted a appointment, appointment already made and patient is fine with date and time. I informed patient we will do any lab work needed at appointment   Interpreter ID# [id number] [facility]

## 2024-06-18 ENCOUNTER — Encounter: Payer: Self-pay | Admitting: Family Medicine

## 2024-06-18 ENCOUNTER — Ambulatory Visit (INDEPENDENT_AMBULATORY_CARE_PROVIDER_SITE_OTHER): Payer: Self-pay | Admitting: Family Medicine

## 2024-06-18 ENCOUNTER — Ambulatory Visit: Payer: Self-pay | Admitting: Family Medicine

## 2024-06-18 VITALS — BP 112/76 | HR 73 | Temp 98.1°F | Resp 16 | Ht 61.0 in | Wt 215.4 lb

## 2024-06-18 DIAGNOSIS — Z114 Encounter for screening for human immunodeficiency virus [HIV]: Secondary | ICD-10-CM

## 2024-06-18 DIAGNOSIS — R7309 Other abnormal glucose: Secondary | ICD-10-CM

## 2024-06-18 DIAGNOSIS — Z1329 Encounter for screening for other suspected endocrine disorder: Secondary | ICD-10-CM

## 2024-06-18 DIAGNOSIS — Z789 Other specified health status: Secondary | ICD-10-CM

## 2024-06-18 DIAGNOSIS — Z1211 Encounter for screening for malignant neoplasm of colon: Secondary | ICD-10-CM

## 2024-06-18 DIAGNOSIS — Z1159 Encounter for screening for other viral diseases: Secondary | ICD-10-CM

## 2024-06-18 DIAGNOSIS — Z13220 Encounter for screening for lipoid disorders: Secondary | ICD-10-CM

## 2024-06-18 DIAGNOSIS — Z603 Acculturation difficulty: Secondary | ICD-10-CM

## 2024-06-18 DIAGNOSIS — Z130 Encounter for screening for diseases of the blood and blood-forming organs and certain disorders involving the immune mechanism: Secondary | ICD-10-CM

## 2024-06-18 DIAGNOSIS — Z0000 Encounter for general adult medical examination without abnormal findings: Secondary | ICD-10-CM

## 2024-06-18 DIAGNOSIS — Z1231 Encounter for screening mammogram for malignant neoplasm of breast: Secondary | ICD-10-CM

## 2024-06-18 DIAGNOSIS — Z13228 Encounter for screening for other metabolic disorders: Secondary | ICD-10-CM

## 2024-06-18 LAB — POCT GLYCOSYLATED HEMOGLOBIN (HGB A1C): Hemoglobin A1C: 6.4 % — AB (ref 4.0–5.6)

## 2024-06-18 MED ORDER — OMEPRAZOLE 40 MG PO CPDR: 40.0000 mg | DELAYED_RELEASE_CAPSULE | Freq: Every day | ORAL | 3 refills | Status: AC

## 2024-06-18 NOTE — Progress Notes (Signed)
 Established Patient Office Visit  Subjective    Patient ID: Wanda Bailey, female    DOB: 10-31-1961  Age: 63 y.o. MRN: [id number]  CC: No chief complaint on file.   HPI Wanda Bailey presents or routine annual exam Patient reports med compliance and also reports some sourness in her mouth at night. This visit was aided by an interpreter.   Outpatient Encounter Medications as of 06/18/2024  Medication Sig   atorvastatin  (LIPITOR) 40 MG tablet Take 1 tablet (40 mg total) by mouth daily. (Patient not taking: Reported on 11/04/2023)   calcium  carbonate (OS-CAL - DOSED IN MG OF ELEMENTAL CALCIUM ) 1250 (500 Ca) MG tablet Take 1 tablet by mouth.   cholecalciferol (VITAMIN D3) 25 MCG (1000 UNIT) tablet Take 1,000 Units by mouth daily.   fluconazole  (DIFLUCAN ) 150 MG tablet Take one tab PO today and repeat dose in 3 days if symptoms persist   HYDROcodone -acetaminophen  (NORCO) 5-325 MG tablet Take 1 tablet by mouth every 6 (six) hours as needed.   meloxicam  (MOBIC ) 7.5 MG tablet Take 1 tablet (7.5 mg total) by mouth 2 (two) times daily as needed for pain.   metFORMIN  (GLUCOPHAGE ) 500 MG tablet Take 1 tablet (500 mg total) by mouth 2 (two) times daily with a meal.   nystatin  cream (MYCOSTATIN ) Apply to affected area 2 times daily   traMADol  (ULTRAM ) 50 MG tablet Take 1 tablet (50 mg total) by mouth 2 (two) times daily as needed.   Facility-Administered Encounter Medications as of 06/18/2024  Medication   triamcinolone  acetonide (KENALOG -40) injection 40 mg    Past Medical History:  Diagnosis Date   Acute lateral meniscus tear of right knee    Diabetes mellitus without complication (HCC)    Hyperlipidemia     Past Surgical History:  Procedure Laterality Date   KNEE ARTHROSCOPY WITH MEDIAL MENISECTOMY Right 03/14/2022   Procedure: right knee arthroscopy partial lateral meniscectomy;  Surgeon: Jerri Kay HERO, MD;  Location: [facility];  Service: Orthopedics;   Laterality: Right;    No family history on file.  Social History   Socioeconomic History   Marital status: Married    Spouse name: Not on file   Number of children: Not on file   Years of education: Not on file   Highest education level: Not on file  Occupational History   Not on file  Tobacco Use   Smoking status: Never   Smokeless tobacco: Never  Vaping Use   Vaping status: Never Used  Substance and Sexual Activity   Alcohol use: No   Drug use: Never   Sexual activity: Yes    Birth control/protection: Post-menopausal    Comment: Last Encounter: [id number]  Other Topics Concern   Not on file  Social History Narrative   ** Merged History Encounter **       Social Drivers of Health   Financial Resource Strain: Medium Risk (10/10/2023)   Overall Financial Resource Strain (CARDIA)    Difficulty of Paying Living Expenses: Somewhat hard  Food Insecurity: Food Insecurity Present (10/10/2023)   Hunger Vital Sign    Worried About Running Out of Food in the Last Year: Sometimes true    Ran Out of Food in the Last Year: Sometimes true  Transportation Needs: Unmet Transportation Needs (10/10/2023)   PRAPARE - Transportation    Lack of Transportation (Medical): Yes    Lack of Transportation (Non-Medical): Yes  Physical Activity: Insufficiently Active (10/10/2023)   Exercise Vital Sign  Days of Exercise per Week: 3 days    Minutes of Exercise per Session: 30 min  Stress: No Stress Concern Present (10/10/2023)   [employer] of Occupational Health - Occupational Stress Questionnaire    Feeling of Stress : Not at all  Social Connections: Moderately Integrated (10/10/2023)   Social Connection and Isolation Panel    Frequency of Communication with Friends and Family: More than three times a week    Frequency of Social Gatherings with Friends and Family: More than three times a week    Attends Religious Services: 1 to 4 times per year    Active Member of [employer] or  Organizations: No    Attends Banker Meetings: Never    Marital Status: Married  Catering manager Violence: Not At Risk (10/10/2023)   Humiliation, Afraid, Rape, and Kick questionnaire    Fear of Current or Ex-Partner: No    Emotionally Abused: No    Physically Abused: No    Sexually Abused: No    Review of Systems  All other systems reviewed and are negative.       Objective    BP 112/76   Pulse 73   Temp 98.1 F (36.7 C) (Oral)   Resp 16   Ht 5' 1 (1.549 m)   Wt 215 lb 6.4 oz (97.7 kg)   SpO2 92%   BMI 40.70 kg/m   Physical Exam Vitals and nursing note reviewed.  Constitutional:      General: She is not in acute distress.    Appearance: She is obese.  HENT:     Head: Normocephalic and atraumatic.     Right Ear: Tympanic membrane, ear canal and external ear normal.     Left Ear: Tympanic membrane, ear canal and external ear normal.     Nose: Nose normal.     Mouth/Throat:     Mouth: Mucous membranes are moist.     Pharynx: Oropharynx is clear.  Eyes:     Conjunctiva/sclera: Conjunctivae normal.     Pupils: Pupils are equal, round, and reactive to light.  Neck:     Thyroid: No thyromegaly.  Cardiovascular:     Rate and Rhythm: Normal rate and regular rhythm.     Heart sounds: Normal heart sounds. No murmur heard. Pulmonary:     Effort: Pulmonary effort is normal. No respiratory distress.     Breath sounds: Normal breath sounds.  Abdominal:     General: There is no distension.     Palpations: Abdomen is soft. There is no mass.     Tenderness: There is no abdominal tenderness.  Musculoskeletal:        General: Normal range of motion.     Cervical back: Normal range of motion and neck supple.  Skin:    General: Skin is warm and dry.  Neurological:     General: No focal deficit present.     Mental Status: She is alert and oriented to person, place, and time.  Psychiatric:        Mood and Affect: Mood normal.        Behavior: Behavior  normal.         Assessment & Plan:   Annual physical exam -     Comprehensive metabolic panel with GFR  Screening for deficiency anemia -     CBC with Differential/Platelet  Screening for lipid disorders -     Lipid panel  Screening for endocrine/metabolic/immunity disorders -     Hemoglobin  A1c -     VITAMIN D 25 Hydroxy (Vit-D Deficiency, Fractures)  Encounter for screening mammogram for malignant neoplasm of breast -     3D Screening Mammogram, Left and Right; Future  Elevated glucose -     POCT glycosylated hemoglobin (Hb A1C)  Screening for HIV (human immunodeficiency virus) -     HIV Antibody (routine testing w rflx)  Need for hepatitis C screening test -     HCV RNA quant rflx ultra or genotyp  Screening for colon cancer -     Fecal occult blood, imunochemical; Future  Language barrier to communication     No follow-ups on file.   Tanda Raguel SQUIBB, MD

## 2024-06-18 NOTE — Progress Notes (Signed)
 Patient is here for their 6 month follow-up Patient has no concerns today Care gaps have been discussed with patient

## 2024-06-20 LAB — LIPID PANEL
Chol/HDL Ratio: 7 ratio — ABNORMAL HIGH (ref 0.0–4.4)
Cholesterol, Total: 343 mg/dL — ABNORMAL HIGH (ref 100–199)
HDL: 49 mg/dL (ref >39)
LDL Chol Calc (NIH): 246 mg/dL — ABNORMAL HIGH (ref 0–99)
Triglycerides: 235 mg/dL — ABNORMAL HIGH (ref 0–149)
VLDL Cholesterol Cal: 48 mg/dL — ABNORMAL HIGH (ref 5–40)

## 2024-06-20 LAB — CBC WITH DIFFERENTIAL/PLATELET
Basophils Absolute: 0.1 x10E3/uL (ref 0.0–0.2)
Basos: 1 %
EOS (ABSOLUTE): 1.5 x10E3/uL — ABNORMAL HIGH (ref 0.0–0.4)
Eos: 20 %
Hematocrit: 42.6 % (ref 34.0–46.6)
Hemoglobin: 14.1 g/dL (ref 11.1–15.9)
Immature Grans (Abs): 0 x10E3/uL (ref 0.0–0.1)
Immature Granulocytes: 0 %
Lymphocytes Absolute: 2.2 x10E3/uL (ref 0.7–3.1)
Lymphs: 29 %
MCH: 30.5 pg (ref 26.6–33.0)
MCHC: 33.1 g/dL (ref 31.5–35.7)
MCV: 92 fL (ref 79–97)
Monocytes Absolute: 0.5 x10E3/uL (ref 0.1–0.9)
Monocytes: 6 %
Neutrophils Absolute: 3.3 x10E3/uL (ref 1.4–7.0)
Neutrophils: 44 %
Platelets: 210 x10E3/uL (ref 150–450)
RBC: 4.63 x10E6/uL (ref 3.77–5.28)
RDW: 12.9 % (ref 11.7–15.4)
WBC: 7.5 x10E3/uL (ref 3.4–10.8)

## 2024-06-20 LAB — COMPREHENSIVE METABOLIC PANEL WITH GFR
ALT: 30 IU/L (ref 0–32)
AST: 28 IU/L (ref 0–40)
Albumin: 4.4 g/dL (ref 3.9–4.9)
Alkaline Phosphatase: 98 IU/L (ref 44–121)
BUN/Creatinine Ratio: 21 (ref 12–28)
BUN: 15 mg/dL (ref 8–27)
Bilirubin Total: <0.2 mg/dL (ref 0.0–1.2)
CO2: 19 mmol/L — ABNORMAL LOW (ref 20–29)
Calcium: 9.3 mg/dL (ref 8.7–10.3)
Chloride: 104 mmol/L (ref 96–106)
Creatinine, Ser: 0.71 mg/dL (ref 0.57–1.00)
Globulin, Total: 2.7 g/dL (ref 1.5–4.5)
Glucose: 80 mg/dL (ref 70–99)
Potassium: 4.4 mmol/L (ref 3.5–5.2)
Sodium: 141 mmol/L (ref 134–144)
Total Protein: 7.1 g/dL (ref 6.0–8.5)
eGFR: 96 mL/min/1.73 (ref >59)

## 2024-06-20 LAB — HEMOGLOBIN A1C
Est. average glucose Bld gHb Est-mCnc: 140 mg/dL
Hgb A1c MFr Bld: 6.5 % — ABNORMAL HIGH (ref 4.8–5.6)

## 2024-06-20 LAB — HCV RNA QUANT RFLX ULTRA OR GENOTYP: HCV Quant Baseline: NOT DETECTED [IU]/mL

## 2024-06-20 LAB — HIV ANTIBODY (ROUTINE TESTING W REFLEX): HIV Screen 4th Generation wRfx: NONREACTIVE

## 2024-06-20 LAB — VITAMIN D 25 HYDROXY (VIT D DEFICIENCY, FRACTURES): Vit D, 25-Hydroxy: 22.8 ng/mL — ABNORMAL LOW (ref 30.0–100.0)

## 2024-06-22 ENCOUNTER — Other Ambulatory Visit: Payer: Self-pay | Admitting: Family Medicine

## 2024-06-22 ENCOUNTER — Ambulatory Visit: Payer: Self-pay

## 2024-06-22 MED ORDER — VITAMIN D (ERGOCALCIFEROL) 1.25 MG (50000 UNIT) PO CAPS: 50000.0000 [IU] | ORAL_CAPSULE | ORAL | 0 refills | Status: AC

## 2024-06-22 NOTE — Telephone Encounter (Signed)
 I have attempted without success to contact this patient by phone to return their call and I left a message on answering machine Aided by Eric [phone].

## 2024-06-22 NOTE — Telephone Encounter (Signed)
 Copied from CRM [id number]. Topic: Clinical - Medication Refill >> Jun 22, 2024 10:35 AM Tobias L wrote: Medication: atorvastatin  (LIPITOR) 40 MG tablet Patient requesting refills, pt states she mentioned it at visit last week. Patient states she has been out for a month.   Has the patient contacted their pharmacy? Yes Told to contact office.   This is the patient's preferred pharmacy:  [facility] [address] ([address]), [place] - [address] [place] ([place]) [place] [address] Phone: [phone] Fax: [phone]  Is this the correct pharmacy for this prescription? Yes  Has the prescription been filled recently? No  Is the patient out of the medication? Yes  Has the patient been seen for an appointment in the last year OR does the patient have an upcoming appointment? Yes  Can we respond through MyChart? No  Agent: Please be advised that Rx refills may take up to 3 business days. We ask that you follow-up with your pharmacy.

## 2024-06-22 NOTE — Telephone Encounter (Signed)
 FYI Only or Action Required?: Action required by provider: clinical question for provider.  Patient was last seen in primary care on 06/18/2024 by Tanda Bleacher, MD. Called Nurse Triage reporting Results. Symptoms began today. Interventions attempted: Nothing. Symptoms are: questions about her cholesterol medication and results, question about her stool teststable.  Triage Disposition: Call PCP Within 24 Hours  Patient/caregiver understands and will follow disposition?: Yes                Copied from CRM [id number]. Topic: Clinical - Lab/Test Results >> Jun 22, 2024 10:34 AM Tobias CROME wrote: Reason for CRM: Relayed lab results to patient. Has additional questions.   Interpreter ID: [place], [id number] Reason for Disposition  Caller requesting lab results  (Exception: Routine or non-urgent lab result.)  Answer Assessment - Initial Assessment Questions 1. REASON FOR CALL or QUESTION: What is your reason for calling today? or How can I best help you? or What question do you have that I can help answer?     Patient asking how her cholesterol results are, read PCP's note: Results are clinically stable/improved. Continue present management. Patient would like to confirm with PCP that she will continue taking the same cholesterol medication/dosage. She also would like a refill of the medication. Patient also has questions about the fecal occult test, if she should fast for it or do it before breakfast in the morning. She is asking if she can take it today. Advised patient specific questions for the test would best be handled by the lab and provided patient with their phone number.  2. CALLER: Document the source of call. (e.g., laboratory, patient).     Patient.  Protocols used: PCP Call - No Triage-A-AH

## 2024-06-23 LAB — SPECIMEN STATUS REPORT

## 2024-06-23 LAB — FECAL OCCULT BLOOD, IMMUNOCHEMICAL: Fecal Occult Bld: NEGATIVE

## 2024-06-24 MED ORDER — ATORVASTATIN CALCIUM 40 MG PO TABS: 40.0000 mg | ORAL_TABLET | Freq: Every day | ORAL | 0 refills | Status: DC

## 2024-06-24 NOTE — Telephone Encounter (Signed)
 Requested Prescriptions  Pending Prescriptions Disp Refills   atorvastatin  (LIPITOR) 40 MG tablet 90 tablet 0    Sig: Take 1 tablet (40 mg total) by mouth daily.     Cardiovascular:  Antilipid - Statins Failed - 06/24/2024 12:03 PM      Failed - Lipid Panel in normal range within the last 12 months    Cholesterol, Total  Date Value Ref Range Status  06/18/2024 343 (H) 100 - 199 mg/dL Final   LDL Chol Calc (NIH)  Date Value Ref Range Status  06/18/2024 246 (H) 0 - 99 mg/dL Final   HDL  Date Value Ref Range Status  06/18/2024 49 >39 mg/dL Final   Triglycerides  Date Value Ref Range Status  06/18/2024 235 (H) 0 - 149 mg/dL Final         Passed - Patient is not pregnant      Passed - Valid encounter within last 12 months    Recent Outpatient Visits           6 days ago Annual physical exam   [place] [facility], MD   7 months ago Elevated cholesterol   [place] [facility], MD   8 months ago Great toe pain, right   [place] [facility], MD   2 years ago Elevated glucose   [place] [facility], MD   2 years ago Annual physical exam    [facility] at [facility], MD

## 2024-07-09 ENCOUNTER — Inpatient Hospital Stay: Admission: RE | Admit: 2024-07-09 | Payer: Self-pay | Source: Ambulatory Visit

## 2024-09-25 ENCOUNTER — Ambulatory Visit: Payer: Self-pay | Admitting: Physician Assistant

## 2024-09-29 ENCOUNTER — Other Ambulatory Visit: Payer: Self-pay | Admitting: Family Medicine

## 2024-09-29 NOTE — Telephone Encounter (Unsigned)
 Copied from CRM [id number]. Topic: Clinical - Medication Refill >> Sep 29, 2024 10:19 AM Kendralyn S wrote: Medication:  atorvastatin  (LIPITOR) 40 MG tablet   Has the patient contacted their pharmacy? Yes (Agent: If no, request that the patient contact the pharmacy for the refill. If patient does not wish to contact the pharmacy document the reason why and proceed with request.) (Agent: If yes, when and what did the pharmacy advise?)  This is the patient's preferred pharmacy:  [facility] [address] ([address]), [place] - [address] [place] ([place]) [place] [address] Phone: [phone] Fax: [phone]  Is this the correct pharmacy for this prescription? Yes If no, delete pharmacy and type the correct one.   Has the prescription been filled recently? No  Is the patient out of the medication? No  Has the patient been seen for an appointment in the last year OR does the patient have an upcoming appointment? Yes  Can we respond through MyChart? No  Agent: Please be advised that Rx refills may take up to 3 business days. We ask that you follow-up with your pharmacy.

## 2024-10-01 MED ORDER — ATORVASTATIN CALCIUM 40 MG PO TABS: 40.0000 mg | ORAL_TABLET | Freq: Every day | ORAL | 1 refills | Status: AC

## 2024-10-01 NOTE — Telephone Encounter (Signed)
 Requested Prescriptions  Pending Prescriptions Disp Refills   atorvastatin  (LIPITOR) 40 MG tablet 90 tablet 1    Sig: Take 1 tablet (40 mg total) by mouth daily.     Cardiovascular:  Antilipid - Statins Failed - 10/01/2024 12:18 PM      Failed - Lipid Panel in normal range within the last 12 months    Cholesterol, Total  Date Value Ref Range Status  06/18/2024 343 (H) 100 - 199 mg/dL Final   LDL Chol Calc (NIH)  Date Value Ref Range Status  06/18/2024 246 (H) 0 - 99 mg/dL Final   HDL  Date Value Ref Range Status  06/18/2024 49 >39 mg/dL Final   Triglycerides  Date Value Ref Range Status  06/18/2024 235 (H) 0 - 149 mg/dL Final         Passed - Patient is not pregnant      Passed - Valid encounter within last 12 months    Recent Outpatient Visits           3 months ago Annual physical exam   [place] [facility], MD   11 months ago Elevated cholesterol   [place] [facility], MD   11 months ago Great toe pain, right   [place] [facility], MD   2 years ago Elevated glucose   [place] [facility] at [facility], MD   2 years ago Annual physical exam   [place] [facility], MD

## 2024-10-19 ENCOUNTER — Encounter: Payer: Self-pay | Admitting: Radiology

## 2024-12-04 ENCOUNTER — Other Ambulatory Visit: Payer: Self-pay | Admitting: Family Medicine

## 2024-12-04 DIAGNOSIS — R7303 Prediabetes: Secondary | ICD-10-CM

## 2024-12-04 NOTE — Telephone Encounter (Signed)
 Copied from CRM #[id number]. Topic: Clinical - Medication Refill >> Dec 04, 2024  4:26 PM Ashley R wrote: Medication:  metFORMIN  (GLUCOPHAGE ) 500 MG tablet   Has the patient contacted their pharmacy? Yes  This is the patient's preferred pharmacy:  [facility] [address] ([address] [place] - [address] [place] ([place]) [place] [address] Phone: [phone] Fax: [phone]  Is this the correct pharmacy for this prescription? Yes If no, delete pharmacy and type the correct one.   Has the prescription been filled recently? Yes  Is the patient out of the medication? No  Has the patient been seen for an appointment in the last year OR does the patient have an upcoming appointment? Yes  Can we respond through MyChart? No  Agent: Please be advised that Rx refills may take up to 3 business days. We ask that you follow-up with your pharmacy.

## 2024-12-08 NOTE — Telephone Encounter (Signed)
 Requested medication (s) are due for refill today -expired Rx  Requested medication (s) are on the active medication list -yes  Future visit scheduled -yes  Last refill: 11/13/21 #180 3RF  Notes to clinic: expired Rx- sent for provider review   Requested Prescriptions  Pending Prescriptions Disp Refills   metFORMIN  (GLUCOPHAGE ) 500 MG tablet 180 tablet 3    Sig: Take 1 tablet (500 mg total) by mouth 2 (two) times daily with a meal.     Endocrinology:  Diabetes - Biguanides Failed - 12/08/2024  3:33 PM      Failed - B12 Level in normal range and within 720 days    No results found for: VITAMINB12       Passed - Cr in normal range and within 360 days    Creatinine, Ser  Date Value Ref Range Status  06/18/2024 0.71 0.57 - 1.00 mg/dL Final         Passed - HBA1C is between 0 and 7.9 and within 180 days    Hemoglobin A1C  Date Value Ref Range Status  06/18/2024 6.4 (A) 4.0 - 5.6 % Final   Hgb A1c MFr Bld  Date Value Ref Range Status  06/18/2024 6.5 (H) 4.8 - 5.6 % Final    Comment:             Prediabetes: 5.7 - 6.4          Diabetes: >6.4          Glycemic control for adults with diabetes: <7.0          Passed - eGFR in normal range and within 360 days    GFR calc Af Amer  Date Value Ref Range Status  08/25/2020 >60 >60 mL/min Final   GFR, Estimated  Date Value Ref Range Status  03/08/2022 >60 >60 mL/min Final    Comment:    (NOTE) Calculated using the CKD-EPI Creatinine Equation (2021)    eGFR  Date Value Ref Range Status  06/18/2024 96 >59 mL/min/1.73 Final         Passed - Valid encounter within last 6 months    Recent Outpatient Visits           5 months ago Annual physical exam   [place] [facility], [facility]   1 year ago Elevated cholesterol   [place] [facility], MD   1 year ago Great toe pain, right   [place] [facility], MD   2 years  ago Elevated glucose   [place] [facility] at [facility] Raguel, MD   2 years ago Annual physical exam    [facility] at [facility] [place], MD              Passed - CBC within normal limits and completed in the last 12 months    WBC  Date Value Ref Range Status  06/18/2024 7.5 3.4 - 10.8 x10E3/uL Final  08/25/2020 10.9 (H) 4.0 - 10.5 K/uL Final   RBC  Date Value Ref Range Status  06/18/2024 4.63 3.77 - 5.28 x10E6/uL Final  08/25/2020 4.54 3.87 - 5.11 MIL/uL Final   Hemoglobin  Date Value Ref Range Status  06/18/2024 14.1 11.1 - 15.9 g/dL Final   Hematocrit  Date Value Ref Range Status  06/18/2024 42.6 34.0 - 46.6 % Final   MCHC  Date Value Ref Range Status  06/18/2024  33.1 31.5 - 35.7 g/dL Final  90/90/7978 69.1 30.0 - 36.0 g/dL Final   [facility]  Date Value Ref Range Status  06/18/2024 30.5 26.6 - 33.0 pg Final  08/25/2020 28.0 26.0 - 34.0 pg Final   MCV  Date Value Ref Range Status  06/18/2024 92 79 - 97 fL Final   No results found for: PLTCOUNTKUC, LABPLAT, POCPLA RDW  Date Value Ref Range Status  06/18/2024 12.9 11.7 - 15.4 % Final            Requested Prescriptions  Pending Prescriptions Disp Refills   metFORMIN  (GLUCOPHAGE ) 500 MG tablet 180 tablet 3    Sig: Take 1 tablet (500 mg total) by mouth 2 (two) times daily with a meal.     Endocrinology:  Diabetes - Biguanides Failed - 12/08/2024  3:33 PM      Failed - B12 Level in normal range and within 720 days    No results found for: VITAMINB12       Passed - Cr in normal range and within 360 days    Creatinine, Ser  Date Value Ref Range Status  06/18/2024 0.71 0.57 - 1.00 mg/dL Final         Passed - HBA1C is between 0 and 7.9 and within 180 days    Hemoglobin A1C  Date Value Ref Range Status  06/18/2024 6.4 (A) 4.0 - 5.6 % Final   Hgb A1c MFr Bld  Date Value Ref Range Status  06/18/2024 6.5 (H) 4.8 - 5.6 % Final    Comment:              Prediabetes: 5.7 - 6.4          Diabetes: >6.4          Glycemic control for adults with diabetes: <7.0          Passed - eGFR in normal range and within 360 days    GFR calc Af Amer  Date Value Ref Range Status  08/25/2020 >60 >60 mL/min Final   GFR, Estimated  Date Value Ref Range Status  03/08/2022 >60 >60 mL/min Final    Comment:    (NOTE) Calculated using the CKD-EPI Creatinine Equation (2021)    eGFR  Date Value Ref Range Status  06/18/2024 96 >59 mL/min/1.73 Final         Passed - Valid encounter within last 6 months    Recent Outpatient Visits           5 months ago Annual physical exam   [place] [facility], MD   1 year ago Elevated cholesterol   [place] [facility], MD   1 year ago Great toe pain, right   [place] [facility], MD   2 years ago Elevated glucose   [place] [facility], MD   2 years ago Annual physical exam   [place] [facility], MD              Passed - CBC within normal limits and completed in the last 12 months    WBC  Date Value Ref Range Status  06/18/2024 7.5 3.4 - 10.8 x10E3/uL Final  08/25/2020 10.9 (H) 4.0 - 10.5 K/uL Final   RBC  Date Value Ref Range Status  06/18/2024 4.63 3.77 - 5.28 x10E6/uL Final  08/25/2020 4.54 3.87 - 5.11  MIL/uL Final   Hemoglobin  Date Value Ref Range Status  06/18/2024 14.1 11.1 - 15.9 g/dL Final   Hematocrit  Date Value Ref Range Status  06/18/2024 42.6 34.0 - 46.6 % Final   MCHC  Date Value Ref Range Status  06/18/2024 33.1 31.5 - 35.7 g/dL Final  90/90/7978 69.1 30.0 - 36.0 g/dL Final   [facility]  Date Value Ref Range Status  06/18/2024 30.5 26.6 - 33.0 pg Final  08/25/2020 28.0 26.0 - 34.0 pg Final   MCV  Date Value Ref Range Status  06/18/2024 92 79 - 97 fL Final   No results found for:  PLTCOUNTKUC, LABPLAT, POCPLA RDW  Date Value Ref Range Status  06/18/2024 12.9 11.7 - 15.4 % Final

## 2024-12-09 MED ORDER — METFORMIN HCL 500 MG PO TABS: 500.0000 mg | ORAL_TABLET | Freq: Two times a day (BID) | ORAL | 3 refills | Status: AC

## 2025-06-21 ENCOUNTER — Encounter: Payer: Self-pay | Admitting: Family Medicine
